# Patient Record
Sex: Male | Born: 1970 | Race: White | Hispanic: No | Smoking: Never smoker
Health system: Southern US, Community
[De-identification: ages and names within clinical notes are randomized; demographics above are authoritative.]

## PROBLEM LIST (undated history)

## (undated) DIAGNOSIS — I456 Pre-excitation syndrome: Secondary | ICD-10-CM

## (undated) DIAGNOSIS — N200 Calculus of kidney: Secondary | ICD-10-CM

## (undated) DIAGNOSIS — B019 Varicella without complication: Secondary | ICD-10-CM

## (undated) DIAGNOSIS — I493 Ventricular premature depolarization: Secondary | ICD-10-CM

## (undated) DIAGNOSIS — J90 Pleural effusion, not elsewhere classified: Secondary | ICD-10-CM

## (undated) DIAGNOSIS — K219 Gastro-esophageal reflux disease without esophagitis: Secondary | ICD-10-CM

## (undated) HISTORY — DX: Varicella without complication: B01.9

## (undated) HISTORY — PX: ADENOIDECTOMY: SHX5191

## (undated) HISTORY — DX: Pleural effusion, not elsewhere classified: J90

## (undated) HISTORY — DX: Calculus of kidney: N20.0

## (undated) HISTORY — DX: Gastro-esophageal reflux disease without esophagitis: K21.9

## (undated) HISTORY — DX: Ventricular premature depolarization: I49.3

---

## 2012-05-27 ENCOUNTER — Ambulatory Visit: Payer: Self-pay | Admitting: Adult Health

## 2014-04-10 ENCOUNTER — Encounter: Payer: Self-pay | Admitting: Podiatry

## 2014-04-10 ENCOUNTER — Ambulatory Visit (INDEPENDENT_AMBULATORY_CARE_PROVIDER_SITE_OTHER): Payer: BC Managed Care – PPO

## 2014-04-10 ENCOUNTER — Ambulatory Visit (INDEPENDENT_AMBULATORY_CARE_PROVIDER_SITE_OTHER): Payer: BC Managed Care – PPO | Admitting: Podiatry

## 2014-04-10 VITALS — BP 129/81 | HR 87 | Resp 16 | Ht 71.0 in | Wt 245.0 lb

## 2014-04-10 DIAGNOSIS — M779 Enthesopathy, unspecified: Secondary | ICD-10-CM

## 2014-04-10 DIAGNOSIS — Q828 Other specified congenital malformations of skin: Secondary | ICD-10-CM

## 2014-04-10 MED ORDER — TRIAMCINOLONE ACETONIDE 10 MG/ML IJ SUSP
10.0000 mg | Freq: Once | INTRAMUSCULAR | Status: AC
  Administered 2014-04-10: 10 mg

## 2014-04-10 NOTE — Progress Notes (Signed)
   Subjective:    Patient ID: Eugene Robinson, male    DOB: [birth year], 43 y.o.   MRN: [id number]  HPI Comments: My right foot had a knot to pop up on the outside (lateral) about 2 months ago. The last two toes are tingling and sometimes numb. i have a lot of pain in my foot. After sitting will hurt. It feels like its broken. i cant bend my big toe on my rt foot. i had broken my foot a year ago but it healed. If i bang my foot on anything it will almost bring tears to my eyes. i take ibuprofen and that's it.  Foot Pain      Review of Systems  Cardiovascular:       Calf pain   Musculoskeletal: Positive for back pain.       Difficulty walking  All other systems reviewed and are negative.      Objective:   Physical Exam        Assessment & Plan:

## 2014-04-11 NOTE — Progress Notes (Signed)
Subjective:     Patient ID: Eugene Robinson, male   DOB: [birth year], 43 y.o.   MRN: [id number]  HPI patient states I'm getting a lot of pain in the outside of my right foot that is making it difficult for me to walk and I think there might be a lesion there. Patient states he walks on cement floors all day and is increasingly having trouble working   Review of Systems     Objective:   Physical Exam Neurovascular status is intact with muscle strength adequate and range of motion of the subtalar and midtarsal joint within normal limits. Patient is noted to have good digital perfusion is well oriented 3 and is noted on the right foot to have inflammation around the fifth metatarsal head that's very tender when pressed and does have lesion formation on the plantar lateral aspect of the fifth metatarsal that's painful when pressed    Assessment:     Inflammatory capsulitis right fifth MPJ with keratotic lesion formation that are painful when pressed    Plan:     H&P and x-rays reviewed. I injected the right fifth MPJ plantar 3 mg Kenalog 5 mg Xylocaine today and I went ahead and after I had also appropriate numbness I debrided the lesions fully. Applied sterile dressing and instructed on reappoint point when symptoms recur

## 2014-11-06 ENCOUNTER — Emergency Department
Admission: EM | Admit: 2014-11-06 | Discharge: 2014-11-06 | Disposition: A | Discharge status: Home / Self Care | Payer: BLUE CROSS/BLUE SHIELD | Attending: Emergency Medicine | Admitting: Emergency Medicine

## 2014-11-06 ENCOUNTER — Other Ambulatory Visit: Payer: Self-pay

## 2014-11-06 ENCOUNTER — Encounter: Payer: Self-pay | Admitting: Emergency Medicine

## 2014-11-06 ENCOUNTER — Emergency Department: Payer: BLUE CROSS/BLUE SHIELD

## 2014-11-06 ENCOUNTER — Telehealth: Payer: Self-pay | Admitting: Cardiovascular Disease

## 2014-11-06 DIAGNOSIS — I456 Pre-excitation syndrome: Secondary | ICD-10-CM | POA: Diagnosis not present

## 2014-11-06 DIAGNOSIS — R079 Chest pain, unspecified: Secondary | ICD-10-CM | POA: Diagnosis present

## 2014-11-06 HISTORY — DX: Pre-excitation syndrome: I45.6

## 2014-11-06 LAB — COMPREHENSIVE METABOLIC PANEL
ALBUMIN: 4.5 g/dL (ref 3.5–5.0)
ALK PHOS: 96 U/L (ref 38–126)
ALT: 30 U/L (ref 17–63)
ANION GAP: 9 (ref 5–15)
AST: 32 U/L (ref 15–41)
BUN: 15 mg/dL (ref 6–20)
CALCIUM: 9 mg/dL (ref 8.9–10.3)
CO2: 26 mmol/L (ref 22–32)
Chloride: 105 mmol/L (ref 101–111)
Creatinine, Ser: 1.04 mg/dL (ref 0.61–1.24)
GLUCOSE: 94 mg/dL (ref 65–99)
Potassium: 3.9 mmol/L (ref 3.5–5.1)
Sodium: 140 mmol/L (ref 135–145)
Total Bilirubin: 1.1 mg/dL (ref 0.3–1.2)
Total Protein: 8.2 g/dL — ABNORMAL HIGH (ref 6.5–8.1)

## 2014-11-06 LAB — CBC
HCT: 47.8 % (ref 40.0–52.0)
HEMOGLOBIN: 16.1 g/dL (ref 13.0–18.0)
MCH: 28.4 pg (ref 26.0–34.0)
MCHC: 33.6 g/dL (ref 32.0–36.0)
MCV: 84.4 fL (ref 80.0–100.0)
PLATELETS: 225 10*3/uL (ref 150–440)
RBC: 5.66 MIL/uL (ref 4.40–5.90)
RDW: 13.2 % (ref 11.5–14.5)
WBC: 8 10*3/uL (ref 3.8–10.6)

## 2014-11-06 LAB — TROPONIN I: Troponin I: <0.03 ng/mL (ref <0.031)

## 2014-11-06 NOTE — ED Notes (Signed)
Pt to ed with c/o left side chest pain that started about 5 days ago,  Reports pain is intermittent with sob and felt like he was going to pass out.  Pt alert and oriented now, hx of WPW.  EKG done on arrival.

## 2014-11-06 NOTE — Discharge Instructions (Signed)
Wolff-Parkinson-White Syndrome  Wolff-Parkinson-White Syndrome (WPW) is an abnormal heart condition that can cause the heart to beat very fast.  CAUSES   In WPW syndrome, an extra electrical connection (pathway) exists between the top chambers of your heart (atria) and the bottom chambers of your heart (ventricles). This is known as an extra (accessory) pathway. This extra pathway can cause the heart to short circuit and beat very fast.  SYMPTOMS   Symptoms in WPW syndrome can vary. These include:  · Feeling your heart "skip" beats (palpitations).  · Dizziness.  · Fainting or near fainting.  · Sudden death.  DIAGNOSIS   WPW can be diagnosed by different test such as:  · ECG (electrocardiogram).  · Echocardiogram.  · Holter monitoring.  · Stress testing.  · Electrophysiology study.  · Laboratory tests that check certain blood levels.  · Thyroid study.  TREATMENT   WPW is usually treated in two ways:  · Radiofrequency destruction (ablation). In this procedure, a thin, flexible tube (catheter) is placed in the heart through a vein in the upper leg (groin). The catheter is guided to the extra pathway. The extra pathway is destroyed by using high frequency radio waves.  · Medicine can sometimes be used to treat WPW.  SEEK IMMEDIATE MEDICAL CARE IF:   · You feel palpitations that are frequent or continual.  · You develop chest pain and also have:  ¨ Shortness of breath or difficulty breathing.  ¨ Nausea and vomiting.  ¨ Sweating.  · You become light-headed or faint (pass out).  MAKE SURE YOU:   · Understand these instructions.  · Will watch your condition.  · Will get help right away if you are not doing well or get worse.  Document Released: 06/20/2003 Document Revised: 06/22/2011 Document Reviewed: 06/16/2008  ExitCare® Patient Information ©2015 [employer]. This information is not intended to replace advice given to you by your health care provider. Make sure you discuss any questions you have with your health care  provider.

## 2014-11-06 NOTE — Telephone Encounter (Signed)
Patient presented to the emergency room with tachycardia, chest discomfort EKG consistent with WPW Now feeling better, initial set of cardiac enzymes negative Second set of cardiac enzymes pending We'll arrange follow-up with Dr. Graciela Husbands, EP to discuss management of his WPW

## 2014-11-06 NOTE — ED Provider Notes (Signed)
[facility] Emergency Department Provider Note  ____________________________________________  Time seen: On arrival  I have reviewed the triage vital signs and the nursing notes.   HISTORY  Chief Complaint Chest Pain    HPI Eugene Robinson is a 44 y.o. male who presents after an episode this morning where he thought that he might faint. He reports that he felt his heart was racing very rapidly he became dizzy, his chest felt tight and he thought he was going to pass out. He has never had this before he does have a history of WPW but reports is never caused him any problems. He denies chest pain now in fact says he feels significant better. No recent travel, no calf pain, no leg swelling. He has not taken any medication for Wolff-Parkinson-White     Past Medical History  Diagnosis Date  . WPW (Wolff-Parkinson-White syndrome)     There are no active problems to display for this patient.   Past Surgical History  Procedure Laterality Date  . Adenoidectomy      Current Outpatient Rx  Name  Route  Sig  Dispense  Refill  . ibuprofen (ADVIL,MOTRIN) 200 MG tablet   Oral   Take 200 mg by mouth as needed.           Allergies Review of patient's allergies indicates no known allergies.  Family History  Problem Relation Age of Onset  . Cancer Mother   . Parkinson's disease Mother     Social History History  Substance Use Topics  . Smoking status: Never Smoker   . Smokeless tobacco: Not on file  . Alcohol Use: No    Review of Systems  Constitutional: Negative for fever. Eyes: Negative for visual changes. ENT: Negative for sore throat Cardiovascular: Positive for chest pain Respiratory: Negative for shortness of breath. Gastrointestinal: Negative for abdominal pain, vomiting and diarrhea. Genitourinary: Negative for dysuria. Musculoskeletal: Negative for back pain. Skin: Negative for rash. Neurological: Negative for headaches or focal  weakness Psychiatric: No anxiety  10-point ROS otherwise negative.  ____________________________________________   PHYSICAL EXAM:  VITAL SIGNS: ED Triage Vitals  Enc Vitals Group     BP 11/06/14 1035 100/71 mmHg     Pulse Rate 11/06/14 1035 88     Resp 11/06/14 1035 18     Temp 11/06/14 1035 97.9 F (36.6 C)     Temp src --      SpO2 11/06/14 1035 96 %     Weight 11/06/14 1035 245 lb (111.131 kg)     Height 11/06/14 1035 5\' 11"  (1.803 m)     Head Cir --      Peak Flow --      Pain Score 11/06/14 1035 5     Pain Loc --      Pain Edu? --      Excl. in GC? --      Constitutional: Alert and oriented. Well appearing and in no distress. Eyes: Conjunctivae are normal.  ENT   Head: Normocephalic and atraumatic.   Mouth/Throat: Mucous membranes are moist. Cardiovascular: Normal rate, regular rhythm. Normal and symmetric distal pulses are present in all extremities. No murmurs, rubs, or gallops. Respiratory: Normal respiratory effort without tachypnea nor retractions. Breath sounds are clear and equal bilaterally.  Gastrointestinal: Soft and non-tender in all quadrants. No distention. There is no CVA tenderness. Genitourinary: deferred Musculoskeletal: Nontender with normal range of motion in all extremities. No lower extremity tenderness nor edema. Neurologic:  Normal speech  and language. No gross focal neurologic deficits are appreciated. Skin:  Skin is warm, dry and intact. No rash noted. Psychiatric: Mood and affect are normal. Patient exhibits appropriate insight and judgment.  ____________________________________________    LABS (pertinent positives/negatives)  Labs Reviewed  COMPREHENSIVE METABOLIC PANEL - Abnormal; Notable for the following:    Total Protein 8.2 (*)    All other components within normal limits  CBC  TROPONIN I  TROPONIN I    ____________________________________________   EKG  ED ECG REPORT I, Jene Every, the attending  physician, personally viewed and interpreted this ECG.   Date: 11/06/2014  EKG Time: 10:31 AM  Rate: 99  Rhythm: Sinus rhythm with PVCs  Axis: Normal  Intervals:Wolff-Parkinson-White  ST&T Change: None   ____________________________________________    RADIOLOGY I have personally reviewed any xrays that were ordered on this patient: Chest x-ray unremarkable  ____________________________________________   PROCEDURES  Procedure(s) performed: none  Critical Care performed: none  ____________________________________________   INITIAL IMPRESSION / ASSESSMENT AND PLAN / ED COURSE  Pertinent labs & imaging results that were available during my care of the patient were reviewed by me and considered in my medical decision making (see chart for details).  Initial labs reassuring. Patient feels well in the Emergency department and chest pain has resolved. Presentation is concerning for arrhythmia related to the WPW. Initial labs reassuring. Discussed with Dr. Mariah Milling of Cardiology. We will recheck 2nd troponin given that patient feels well. Outpatient EP follow up arranged. Patient and wife agree with plan.   ----------------------------------------- 3:01 PM on 11/06/2014 -----------------------------------------  Patient feels well. Second troponin normal. Return precautions given  ____________________________________________   FINAL CLINICAL IMPRESSION(S) / ED DIAGNOSES  Final diagnoses:  WPW (Wolff-Parkinson-White syndrome)     Jene Every, MD 11/06/14 [phone]

## 2014-11-07 ENCOUNTER — Encounter: Payer: Self-pay | Admitting: Family Medicine

## 2014-11-07 ENCOUNTER — Ambulatory Visit (INDEPENDENT_AMBULATORY_CARE_PROVIDER_SITE_OTHER): Payer: BLUE CROSS/BLUE SHIELD | Admitting: Family Medicine

## 2014-11-07 VITALS — BP 112/76 | HR 69 | Temp 98.1°F | Ht 70.75 in | Wt 250.1 lb

## 2014-11-07 DIAGNOSIS — Z13220 Encounter for screening for lipoid disorders: Secondary | ICD-10-CM

## 2014-11-07 DIAGNOSIS — Z0000 Encounter for general adult medical examination without abnormal findings: Secondary | ICD-10-CM | POA: Insufficient documentation

## 2014-11-07 DIAGNOSIS — I456 Pre-excitation syndrome: Secondary | ICD-10-CM | POA: Insufficient documentation

## 2014-11-07 DIAGNOSIS — Z23 Encounter for immunization: Secondary | ICD-10-CM | POA: Diagnosis not present

## 2014-11-07 DIAGNOSIS — E669 Obesity, unspecified: Secondary | ICD-10-CM | POA: Insufficient documentation

## 2014-11-07 DIAGNOSIS — Z136 Encounter for screening for cardiovascular disorders: Secondary | ICD-10-CM

## 2014-11-07 NOTE — Patient Instructions (Signed)
It was nice to see you today.  Follow up next week for your labs and tetanus shot.  Take care  Dr. Adriana Simas

## 2014-11-07 NOTE — Assessment & Plan Note (Signed)
Encouraged healthy diet and exercise (patient to abstain from exercise at this time until he is evaluated by cardiologist Dr. Graciela Husbands).

## 2014-11-07 NOTE — Assessment & Plan Note (Signed)
Patient doing okay at this time. Patient to see EP cardiologist Tues. Patient to abstain from exercise/heavy lifting/strenous activity until sees cards.

## 2014-11-07 NOTE — Progress Notes (Signed)
   Subjective:    Patient ID: Eugene Robinson, male    DOB: 06/29/1970, 44 y.o.   MRN: [id number]  HPI 44 year old male presents to clinic today to establish care and for an annual physical exam. Current issues below:  Preventative health care  Patient in need of tetanus immunization.  Given current guidelines patient need of screening lipid panel.   No other preventative care measures needed.  WPW  Patient reports he was diagnosed years ago with WPW.  Patient recently had to go the ED after he developed tachycardia and felt faint.   WPW was again noted on EKG.  Labs and workup were unremarkable.  Patient was discharged home in stable condition and EP follow up with Dr. Graciela Husbands was scheduled (he sees him on Tuesday).  Patient reports that since discharge the hospital he still has intermittent appears of time where he does not feel like himself. He states that his heart feels like a "roller coaster".  He has had no further episodes where he feels like he is going to faint.  No current chest pain, SOB or palpitations.  Review of Systems Per HPI.  All other systems negative.     Objective: Blood pressure 112/76, pulse 69, temperature 98.1 F (36.7 C), temperature source Oral, height 5' 10.75" (1.797 m), weight 250 lb 2 oz (113.456 kg), SpO2 95 %. Body mass index is 35.13 kg/(m^2).    Physical Exam  Constitutional: He is oriented to person, place, and time. He appears well-developed and well-nourished. No distress.  Obese.   HENT:  Head: Normocephalic and atraumatic.  Mouth/Throat: Oropharynx is clear and moist. No oropharyngeal exudate.  Eyes: No scleral icterus.  Neck: Neck supple.  Cardiovascular: Normal rate and regular rhythm.   No murmur heard. Pulmonary/Chest: Effort normal and breath sounds normal. No respiratory distress. He has no wheezes. He has no rales.  Abdominal: Soft. He exhibits no distension. There is no tenderness. There is no rebound and no guarding.    Musculoskeletal: Normal range of motion. He exhibits no edema.  Lymphadenopathy:    He has no cervical adenopathy.  Neurological: He is alert and oriented to person, place, and time.  Skin: Skin is warm and dry. No rash noted.  Psychiatric: He has a normal mood and affect.  Vitals reviewed.     Assessment & Plan:  See Problem List

## 2014-11-07 NOTE — Telephone Encounter (Signed)
Pt is scheduled for 11/13/2014 8:45 - Dr. Graciela Husbands

## 2014-11-07 NOTE — Assessment & Plan Note (Signed)
Patient in need Tdap and lipid screening. Patient will return for this as he does not want to be stuck as blood and IV's were recently done at [facility]. F/u appt scheduled.

## 2014-11-07 NOTE — Progress Notes (Signed)
Pre visit review using our clinic review tool, if applicable. No additional management support is needed unless otherwise documented below in the visit note. 

## 2014-11-13 ENCOUNTER — Encounter: Payer: Self-pay | Admitting: Internal Medicine

## 2014-11-13 ENCOUNTER — Ambulatory Visit: Payer: BLUE CROSS/BLUE SHIELD | Admitting: Internal Medicine

## 2014-11-13 VITALS — BP 118/82 | HR 83 | Ht 70.0 in | Wt 248.8 lb

## 2014-11-13 DIAGNOSIS — R0602 Shortness of breath: Secondary | ICD-10-CM | POA: Diagnosis not present

## 2014-11-13 DIAGNOSIS — R5383 Other fatigue: Secondary | ICD-10-CM | POA: Diagnosis not present

## 2014-11-13 DIAGNOSIS — I456 Pre-excitation syndrome: Secondary | ICD-10-CM | POA: Diagnosis not present

## 2014-11-13 MED ORDER — ATENOLOL 50 MG PO TABS: 50.0000 mg | ORAL_TABLET | Freq: Every day | ORAL | Status: DC

## 2014-11-13 MED ORDER — METOPROLOL TARTRATE 50 MG PO TABS: 50.0000 mg | ORAL_TABLET | Freq: Two times a day (BID) | ORAL | Status: DC

## 2014-11-13 MED ORDER — METOPROLOL SUCCINATE ER 50 MG PO TB24: 50.0000 mg | ORAL_TABLET | Freq: Every day | ORAL | Status: DC

## 2014-11-13 NOTE — Progress Notes (Signed)
ELECTROPHYSIOLOGY CONSULT NOTE  Patient ID: Eugene Robinson, MRN: [id number], DOB/AGE: 12-27-1970 44 y.o. Admit date: (Not on file) Date of Consult: 11/13/2014  Primary Physician: Everlene Other, DO Primary Cardiologist: new  Chief Complaint: WPW   HPI Eugene Robinson is a 44 y.o. male  Referred from the emergency room following an episode of tachycardia palpitations and presyncope in the context of little echocardiogram demonstrating ventricular preexcitation.  His ECG have been identified 20 years ago when he presented to Dr. Glennon Hamilton with chest pain. He had had at that time no tachycardia palpitations and no interventions were recommended.  He has a long history of flutters. These are sometimes associated with dyspnea. More recently he had an episode where the context of flutters his heart started to race. In the past these episodes had lasted fractions of minutes; this episode lasted much longer i.e. 10-15 minutes.  Since that time his struggled with exercise intolerance. He notes that the flutters that he has had infrequently in the past, i.e. monthly are now occurring all day long.  He has sleep disordered breathing and snores.      Past Medical History  Diagnosis Date  . WPW (Wolff-Parkinson-White syndrome)   . Chicken pox   . GERD (gastroesophageal reflux disease)   . Kidney stones       Surgical History:  Past Surgical History  Procedure Laterality Date  . Adenoidectomy       Home Meds: Prior to Admission medications   Medication Sig Start Date End Date Taking? Authorizing Provider  ibuprofen (ADVIL,MOTRIN) 200 MG tablet Take 200 mg by mouth as needed.   Yes Historical Provider, MD      Allergies: No Known Allergies  History   Social History  . Marital Status: Married    Spouse Name: N/A  . Number of Children: N/A  . Years of Education: N/A   Occupational History  . Not on file.   Social History Main Topics  . Smoking status: Never Smoker   .  Smokeless tobacco: Not on file  . Alcohol Use: No  . Drug Use: No  . Sexual Activity: Not on file   Other Topics Concern  . Not on file   Social History Narrative     Family History  Problem Relation Age of Onset  . Cancer Mother     lung and breast  . Parkinson's disease Mother      ROS:  Please see the history of present illness.     All other systems reviewed and negative.    Physical Exam: Blood pressure 118/82, pulse 83, height  (1.778 m), weight 248 lb 12 oz (112.832 kg). General: Well developed, well nourished male in no acute distress. Head: Normocephalic, atraumatic, sclera non-icteric, no xanthomas, nares are without discharge. EENT: normal Lymph Nodes:  none Back: without scoliosis/kyphosis, no CVA tendersness Neck: Negative for carotid bruits. JVD not elevated. Lungs: Clear bilaterally to auscultation without wheezes, rales, or rhonchi. Breathing is unlabored. Heart: RRR with S1 S2. No  murmur , rubs, or gallops appreciated. Abdomen: Soft, non-tender, non-distended with normoactive bowel sounds. No hepatomegaly. No rebound/guarding. No obvious abdominal masses. Msk:  Strength and tone appear normal for age. Extremities: No clubbing or cyanosis. No edema.  Distal pedal pulses are 2+ and equal bilaterally. Skin: Warm and Dry Neuro: Alert and oriented X 3. CN III-XII intact Grossly normal sensory and motor function . Psych:  Responds to questions appropriately with a normal affect.  Labs: Cardiac Enzymes No results for input(s): CKTOTAL, CKMB, TROPONINI in the last 72 hours. CBC Lab Results  Component Value Date   WBC 8.0 11/06/2014   HGB 16.1 11/06/2014   HCT 47.8 11/06/2014   MCV 84.4 11/06/2014   PLT 225 11/06/2014   PROTIME: No results for input(s): LABPROT, INR in the last 72 hours. Chemistry  Recent Labs Lab 11/06/14 1111  NA 140  K 3.9  CL 105  CO2 26  BUN 15  CREATININE 1.04  CALCIUM 9.0  PROT 8.2*  BILITOT 1.1  ALKPHOS 96    ALT 30  AST 32  GLUCOSE 94   Lipids No results found for: CHOL, HDL, LDLCALC, TRIG BNP No results found for: PROBNP Thyroid Function Tests: No results for input(s): TSH, T4TOTAL, T3FREE, THYROIDAB in the last 72 hours.  Invalid input(s): FREET3    Miscellaneous No results found for: DDIMER  Radiology/Studies:  Dg Chest Portable 1 View  11/06/2014   CLINICAL DATA:  Left chest pain starting 5 days ago  EXAM: PORTABLE CHEST - 1 VIEW  COMPARISON:  None.  FINDINGS: Mediastinal contour is normal. Heart size is enlarged. The lung volumes are low. There is mild central pulmonary vascular congestion. There is no focal infiltrate, pulmonary edema, or pleural effusion. The visualized skeletal structures are unremarkable.  IMPRESSION: Mild central pulmonary vascular congestion. No focal pneumonia or frank edema.   Electronically Signed   By: Sherian Rein M.D.   On: 11/06/2014 12:08    EKG: SR  Negative delta wave in lead 3 positive delta wave 1, L, transition V1-V2 consistent with a right posterior septal pathway ECG 726 demonstrates sinus rhythm with ventricular preexcitation and PVCs with a right bundle superior axis morphology  Assessment and Plan:  Wolff-Parkinson-White  PVCs-right bundle superior axis  Dyspnea on exertion   The patient has Wolff-Parkinson-White syndrome with ventricular preexcitation and tachypalpitations. In the wake of this event a week ago he's had profound dyspnea and exercise intolerance associated with a perceived increase in his flutters which I suspect are related to the PVCs noted on his electrocardiogram.  It would not be my expectation that the event of tachycardia last week would have residual effects.  Given this, I'm not sure that ablation of his accessory pathway for the purpose of relief of his dyspnea symptoms is going to be helpful; hence, we have discussed the role of beta blocker therapy and I have given him prescriptions for atenolol, metoprolol  succinate and tartrate and have reviewed the side effects to see if we can find a beta blocker that works to decrease his PVC burden and hopefully thereby improve his exercise performance. He and his wife both do not think that this is related to anxiety. Attributable to the tachycardia episode.   We will undertake a stress test to see if we can understand what's happening to cause his exercise intolerance whether to increase his PVC frequency a rapid escalation in heart rate. I'm not sure the under mechanism of this dizziness; we will do orthostatics today.  We'll also undertake an echocardiogram; there is a syndrome of WPW and HCM   Sherryl Manges

## 2014-11-13 NOTE — Patient Instructions (Addendum)
Medication Instructions:  Your physician has recommended you make the following change in your medication:  You have been given three prescriptions: Atenolol 50mg  once per day Metoprolol tartrate 50mg  twice per day Metoprolol succinate 50mg  once per day. DO NOT take all three medications at the same time. Per MD instructions, choose one to try. If still symptomatic, discontinue and fill one of the other prescriptions until you find one that works for you. Call us back for refills on the one you choose.   Labwork: none  Testing/Procedures: Your physician has requested that you have an echocardiogram. Echocardiography is a painless test that uses sound waves to create images of your heart. It provides your doctor with information about the size and shape of your heart and how well your heart's chambers and valves are working. This procedure takes approximately one hour. There are no restrictions for this procedure.  Your physician has requested that you have a lexiscan myoview.    [facility] MYOVIEW  Your caregiver has ordered a Stress Test with nuclear imaging. The purpose of this test is to evaluate the blood supply to your heart muscle. This procedure is referred to as a "Non-Invasive Stress Test." This is because other than having an IV started in your vein, nothing is inserted or "invades" your body. Cardiac stress tests are done to find areas of poor blood flow to the heart by determining the extent of coronary artery disease (CAD). Some patients exercise on a treadmill, which naturally increases the blood flow to your heart, while others who are  unable to walk on a treadmill due to physical limitations have a pharmacologic/chemical stress agent called Lexiscan . This medicine will mimic walking on a treadmill by temporarily increasing your coronary blood flow.   Please note: these test may take anywhere between 2-4 hours to complete  PLEASE REPORT TO [facility]  THE VOLUNTEERS  AT THE FIRST DESK WILL DIRECT YOU WHERE TO GO  Date of Procedure: Thursday, August 4, 8:00am  Arrival Time for Procedure: 7:15am  Instructions regarding medication:   ____ : Hold diabetes medication morning of procedure  ___xxx_:  Hold betablocker(s) night before procedure and morning of procedure. Do not take atenolol or metoprolol the evening before or morning of your procedure  ____:  Hold other medications as follows:_________________________________________________________________________________________________________________________________________________________________________________________________________________________________________________________________________________________  PLEASE NOTIFY THE OFFICE AT LEAST 24 HOURS IN ADVANCE IF YOU ARE UNABLE TO KEEP YOUR APPOINTMENT.  [phone] AND  PLEASE NOTIFY NUCLEAR MEDICINE AT [facility] AT LEAST 24 HOURS IN ADVANCE IF YOU ARE UNABLE TO KEEP YOUR APPOINTMENT. [phone]  How to prepare for your Myoview test:   Do not eat or drink after midnight  No caffeine for 24 hours prior to test  No smoking 24 hours prior to test.  Your medication may be taken with water.  If your doctor stopped a medication because of this test, do not take that medication.  Ladies, please do not wear dresses.  Skirts or pants are appropriate. Please wear a short sleeve shirt.  No perfume, cologne or lotion.  Wear comfortable walking shoes. No heels!            Follow-Up: Your physician recommends that you schedule a follow-up appointment in: six weeks with Dr. Graciela Husbands.   Any Other Special Instructions Will Be Listed Below (If Applicable).  Echocardiogram An echocardiogram, or echocardiography, uses sound waves (ultrasound) to produce an image of your heart. The echocardiogram is simple, painless, obtained within a short period of time,  and offers valuable information to your health care provider. The images from an echocardiogram  can provide information such as:  Evidence of coronary artery disease (CAD).  Heart size.  Heart muscle function.  Heart valve function.  Aneurysm detection.  Evidence of a past heart attack.  Fluid buildup around the heart.  Heart muscle thickening.  Assess heart valve function. LET [facility] CARE PROVIDER KNOW ABOUT:  Any allergies you have.  All medicines you are taking, including vitamins, herbs, eye drops, creams, and over-the-counter medicines.  Previous problems you or members of your family have had with the use of anesthetics.  Any blood disorders you have.  Previous surgeries you have had.  Medical conditions you have.  Possibility of pregnancy, if this applies. BEFORE THE PROCEDURE  No special preparation is needed. Eat and drink normally.  PROCEDURE   In order to produce an image of your heart, gel will be applied to your chest and a wand-like tool (transducer) will be moved over your chest. The gel will help transmit the sound waves from the transducer. The sound waves will harmlessly bounce off your heart to allow the heart images to be captured in real-time motion. These images will then be recorded.  You may need an IV to receive a medicine that improves the quality of the pictures. AFTER THE PROCEDURE You may return to your normal schedule including diet, activities, and medicines, unless your health care provider tells you otherwise. Document Released: 03/27/2000 Document Revised: 08/14/2013 Document Reviewed: 12/05/2012 [facility] Patient Information 2015 [place], [place]. This information is not intended to replace advice given to you by your health care provider. Make sure you discuss any questions you have with your health care provider. Nuclear Medicine Exam A nuclear medicine exam is a safe and painless imaging test. It helps to detect and diagnose disease in the body as well as provide information about organ function and structure.  Nuclear  scans are most often done of the:  Lungs.  Heart.  Thyroid gland.  Bones.  Abdomen. HOW A NUCLEAR MEDICINE EXAM WORKS A nuclear medicine exam works by using a radioactive tracer. The material is given either by an IV (intravenous) injection or it may be swallowed. After the tracer is in the body, it is absorbed by your body's organs. A large scanning machine that uses a special camera detects the radioactivity in your body. A computerized image is then formed regarding the area of concern. The small amounts of radioactive material used in a nuclear medicine exam are found to be medically safe. However, because radioactive material is used, this test is not done if you are pregnant or nursing.  BEFORE THE PROCEDURE  If available, bring previous imaging studies such as x-rays, etc. with you to the exam.  Arrive early for your exam. PROCEDURE  An IV may be started before the exam begins.  Depending on the type of examination, will lie on a table or sit in a chair during the exam.  The nuclear medicine exam will take about 30 to 60 minutes to complete. AFTER THE PROCEDURE  After your scan is completed, the image(s) will be evaluated by a specialist. It is important that you follow up with your caregiver to find out your test results.  You may return to your regular activity as instructed by your caregiver. SEEK IMMEDIATE MEDICAL CARE IF: You have shortness of breath or difficulty breathing. MAKE SURE YOU:   Understand these instructions.  Will watch your condition.  Will get help right away if you are not doing well or get worse. Document Released: 05/07/2004 Document Revised: 06/22/2011 Document Reviewed: 06/21/2008 [facility] Patient Information 2015 [place], [place]. This information is not intended to replace advice given to you by your health care provider. Make sure you discuss any questions you have with your health care provider.

## 2014-11-14 ENCOUNTER — Telehealth: Payer: Self-pay

## 2014-11-14 ENCOUNTER — Telehealth: Payer: Self-pay | Admitting: Internal Medicine

## 2014-11-14 ENCOUNTER — Other Ambulatory Visit (INDEPENDENT_AMBULATORY_CARE_PROVIDER_SITE_OTHER): Payer: BLUE CROSS/BLUE SHIELD

## 2014-11-14 ENCOUNTER — Ambulatory Visit: Payer: BLUE CROSS/BLUE SHIELD

## 2014-11-14 DIAGNOSIS — Z136 Encounter for screening for cardiovascular disorders: Secondary | ICD-10-CM

## 2014-11-14 DIAGNOSIS — Z13220 Encounter for screening for lipoid disorders: Secondary | ICD-10-CM

## 2014-11-14 DIAGNOSIS — E669 Obesity, unspecified: Secondary | ICD-10-CM

## 2014-11-14 DIAGNOSIS — Z23 Encounter for immunization: Secondary | ICD-10-CM

## 2014-11-14 LAB — LIPID PANEL
Cholesterol: 129 mg/dL (ref 0–200)
HDL: 33.5 mg/dL — ABNORMAL LOW (ref >39.00)
LDL Cholesterol: 74 mg/dL (ref 0–99)
NonHDL: 95.68
TRIGLYCERIDES: 109 mg/dL (ref 0.0–149.0)
Total CHOL/HDL Ratio: 4
VLDL: 21.8 mg/dL (ref 0.0–40.0)

## 2014-11-14 LAB — HEMOGLOBIN A1C: HEMOGLOBIN A1C: 5.4 % (ref 4.6–6.5)

## 2014-11-14 MED ORDER — TETANUS-DIPHTH-ACELL PERTUSSIS 5-2.5-18.5 LF-MCG/0.5 IM SUSP
0.5000 mL | Freq: Once | INTRAMUSCULAR | Status: AC
  Administered 2014-11-14: 0.5 mL via INTRAMUSCULAR

## 2014-11-14 NOTE — Telephone Encounter (Signed)
Pt called back and was given his results.

## 2014-11-14 NOTE — Telephone Encounter (Signed)
Patient called to cancel nm stress at [facility], echo, and fu with klein because his wife just had surgery and they cannot afford insurance deductible for testing at this time. Per patient he will call back at a later time to reschedule.  Notified scheduling at [facility].  Fwd to pcp.

## 2014-11-14 NOTE — Progress Notes (Signed)
Patient came for immunization, tdap.  Received in right deltoid.  Patient tolerated well.

## 2014-11-14 NOTE — Telephone Encounter (Signed)
Cancelled myoview with scheduling.  S/w pt to inform him that we have cancelled tests and to notify us when he feels like he can reschedule. Forward to [employer]

## 2014-11-14 NOTE — Telephone Encounter (Signed)
Called patient to give him his lab results, but there was no answer told him to call me back to get his results.

## 2014-11-16 ENCOUNTER — Other Ambulatory Visit: Payer: BLUE CROSS/BLUE SHIELD

## 2014-12-18 ENCOUNTER — Ambulatory Visit: Payer: BLUE CROSS/BLUE SHIELD | Admitting: Internal Medicine

## 2015-01-03 ENCOUNTER — Other Ambulatory Visit: Payer: Self-pay | Admitting: Internal Medicine

## 2015-01-21 ENCOUNTER — Telehealth: Payer: Self-pay | Admitting: *Deleted

## 2015-01-21 NOTE — Telephone Encounter (Signed)
Pt will call us back to let us know which medication pt will need refill.

## 2015-01-22 ENCOUNTER — Ambulatory Visit: Payer: BLUE CROSS/BLUE SHIELD | Admitting: Internal Medicine

## 2015-03-19 ENCOUNTER — Ambulatory Visit (INDEPENDENT_AMBULATORY_CARE_PROVIDER_SITE_OTHER): Payer: BLUE CROSS/BLUE SHIELD | Admitting: Internal Medicine

## 2015-03-19 ENCOUNTER — Encounter: Payer: Self-pay | Admitting: Internal Medicine

## 2015-03-19 VITALS — BP 116/76 | HR 68 | Ht 70.0 in | Wt 254.8 lb

## 2015-03-19 DIAGNOSIS — I493 Ventricular premature depolarization: Secondary | ICD-10-CM

## 2015-03-19 DIAGNOSIS — R0602 Shortness of breath: Secondary | ICD-10-CM | POA: Diagnosis not present

## 2015-03-19 DIAGNOSIS — I456 Pre-excitation syndrome: Secondary | ICD-10-CM

## 2015-03-19 HISTORY — DX: Ventricular premature depolarization: I49.3

## 2015-03-19 MED ORDER — METOPROLOL TARTRATE 50 MG PO TABS: 50.0000 mg | ORAL_TABLET | Freq: Two times a day (BID) | ORAL | Status: DC

## 2015-03-19 NOTE — Progress Notes (Signed)
      Patient Care Team: Tommie SamsJayce G Cook, DO as PCP - General (Family Medicine)   HPI  Eugene Robinson is a 44 y.o. male Seen in follow-up for shortness of breath. 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 Furthermore, palpitations and been associated with PVCs  He tried a variety of beta blockers; metoprolol tartrate as been associated with significant diminution in the frequency of his events i.e. flutters. He has no attributable side effects     Past Medical History  Diagnosis Date  . WPW (Wolff-Parkinson-White syndrome)   . Chicken pox   . GERD (gastroesophageal reflux disease)   . Kidney stones   . PVC's (premature ventricular contractions) 03/19/2015    Past Surgical History  Procedure Laterality Date  . Adenoidectomy      Current Outpatient Prescriptions  Medication Sig Dispense Refill  . ibuprofen (ADVIL,MOTRIN) 200 MG tablet Take 200 mg by mouth as needed.    . metoprolol (LOPRESSOR) 50 MG tablet TAKE 1 TABLET (50 MG TOTAL) BY MOUTH 2 (TWO) TIMES DAILY. 60 tablet 3   No current facility-administered medications for this visit.    No Known Allergies    Review of Systems negative except from HPI and PMH  Physical Exam BP 116/76 mmHg  Pulse 68  Ht 5\' 10"  (1.778 m)  Wt 254 lb 12 oz (115.554 kg)  BMI 36.55 kg/m2 Well developed and well nourished in no acute distress HENT normal E scleral and icterus clear Neck Supple JVP flat; carotids brisk and full Clear to ausculation  Regular rate and rhythm, no murmurs gallops or rub loud s1 Soft with active bowel sounds No clubbing cyanosis   Edema Alert and oriented, grossly normal motor and sensory function Skin Warm and Dry  ECG demonstrates sinus rhythm at 68 intervals 01/22/42 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 Right now it is cost prohibitive  I will be in touch with his regular care physician regarding a sleep study; he has high likelihood of sleep apnea

## 2015-03-19 NOTE — Patient Instructions (Signed)
Medication Instructions: - no changes  Labwork: - none  Procedures/Testing: - none  Follow-Up: - Your physician wants you to follow-up in: 1 year with Dr. Klein. You will receive a reminder letter in the mail two months in advance. If you don't receive a letter, please call our office to schedule the follow-up appointment.  Any Additional Special Instructions Will Be Listed Below (If Applicable). - none  

## 2015-03-22 ENCOUNTER — Other Ambulatory Visit: Payer: Self-pay | Admitting: Family Medicine

## 2015-03-22 DIAGNOSIS — R0683 Snoring: Secondary | ICD-10-CM

## 2015-03-22 DIAGNOSIS — G4733 Obstructive sleep apnea (adult) (pediatric): Secondary | ICD-10-CM

## 2015-04-14 DIAGNOSIS — J90 Pleural effusion, not elsewhere classified: Secondary | ICD-10-CM

## 2015-04-14 DIAGNOSIS — M869 Osteomyelitis, unspecified: Secondary | ICD-10-CM

## 2015-04-14 HISTORY — DX: Osteomyelitis, unspecified: M86.9

## 2015-04-14 HISTORY — DX: Pleural effusion, not elsewhere classified: J90

## 2015-06-26 ENCOUNTER — Ambulatory Visit: Payer: BLUE CROSS/BLUE SHIELD | Admitting: Family Medicine

## 2015-06-28 ENCOUNTER — Emergency Department: Payer: BLUE CROSS/BLUE SHIELD

## 2015-06-28 ENCOUNTER — Encounter: Payer: Self-pay | Admitting: Emergency Medicine

## 2015-06-28 ENCOUNTER — Emergency Department
Admission: EM | Admit: 2015-06-28 | Discharge: 2015-06-28 | Disposition: A | Discharge status: Home / Self Care | Payer: BLUE CROSS/BLUE SHIELD | Attending: Emergency Medicine | Admitting: Emergency Medicine

## 2015-06-28 DIAGNOSIS — R1031 Right lower quadrant pain: Secondary | ICD-10-CM | POA: Insufficient documentation

## 2015-06-28 DIAGNOSIS — R109 Unspecified abdominal pain: Secondary | ICD-10-CM

## 2015-06-28 LAB — URINALYSIS COMPLETE WITH MICROSCOPIC (ARMC ONLY)
Bacteria, UA: NONE SEEN
Bilirubin Urine: NEGATIVE
GLUCOSE, UA: NEGATIVE mg/dL
Ketones, ur: NEGATIVE mg/dL
LEUKOCYTES UA: NEGATIVE
NITRITE: NEGATIVE
PH: 5 (ref 5.0–8.0)
Protein, ur: 100 mg/dL — AB
SPECIFIC GRAVITY, URINE: 1.019 (ref 1.005–1.030)

## 2015-06-28 LAB — CBC WITH DIFFERENTIAL/PLATELET
BASOS PCT: 1 %
Basophils Absolute: 0.1 10*3/uL (ref 0–0.1)
EOS ABS: 0.2 10*3/uL (ref 0–0.7)
EOS PCT: 2 %
HCT: 42.6 % (ref 40.0–52.0)
Hemoglobin: 14.5 g/dL (ref 13.0–18.0)
Lymphocytes Relative: 7 %
Lymphs Abs: 0.7 10*3/uL — ABNORMAL LOW (ref 1.0–3.6)
MCH: 29.1 pg (ref 26.0–34.0)
MCHC: 34 g/dL (ref 32.0–36.0)
MCV: 85.7 fL (ref 80.0–100.0)
MONOS PCT: 11 %
Monocytes Absolute: 1.1 10*3/uL — ABNORMAL HIGH (ref 0.2–1.0)
Neutro Abs: 8.1 10*3/uL — ABNORMAL HIGH (ref 1.4–6.5)
Neutrophils Relative %: 79 %
PLATELETS: 194 10*3/uL (ref 150–440)
RBC: 4.97 MIL/uL (ref 4.40–5.90)
RDW: 13.1 % (ref 11.5–14.5)
WBC: 10.2 10*3/uL (ref 3.8–10.6)

## 2015-06-28 LAB — BASIC METABOLIC PANEL
Anion gap: 9 (ref 5–15)
BUN: 18 mg/dL (ref 6–20)
CHLORIDE: 102 mmol/L (ref 101–111)
CO2: 23 mmol/L (ref 22–32)
Calcium: 8.5 mg/dL — ABNORMAL LOW (ref 8.9–10.3)
Creatinine, Ser: 1.01 mg/dL (ref 0.61–1.24)
GFR calc Af Amer: >60 mL/min (ref >60)
Glucose, Bld: 118 mg/dL — ABNORMAL HIGH (ref 65–99)
Potassium: 3.6 mmol/L (ref 3.5–5.1)
SODIUM: 134 mmol/L — AB (ref 135–145)

## 2015-06-28 MED ORDER — ONDANSETRON HCL 4 MG/2ML IJ SOLN
4.0000 mg | Freq: Once | INTRAMUSCULAR | Status: AC
  Administered 2015-06-28: 4 mg via INTRAVENOUS

## 2015-06-28 MED ORDER — IOHEXOL 240 MG/ML SOLN
25.0000 mL | Freq: Once | INTRAMUSCULAR | Status: AC | PRN
  Administered 2015-06-28: 25 mL via INTRAVENOUS

## 2015-06-28 MED ORDER — IOHEXOL 300 MG/ML  SOLN
100.0000 mL | Freq: Once | INTRAMUSCULAR | Status: AC | PRN
  Administered 2015-06-28: 100 mL via INTRAVENOUS

## 2015-06-28 MED ORDER — MORPHINE SULFATE (PF) 4 MG/ML IV SOLN
4.0000 mg | Freq: Once | INTRAVENOUS | Status: AC
  Administered 2015-06-28: 4 mg via INTRAVENOUS

## 2015-06-28 MED ORDER — ONDANSETRON HCL 4 MG/2ML IJ SOLN
4.0000 mg | Freq: Once | INTRAMUSCULAR | Status: AC
  Administered 2015-06-28: 4 mg via INTRAVENOUS
  Filled 2015-06-28: qty 2

## 2015-06-28 MED ORDER — MORPHINE SULFATE (PF) 4 MG/ML IV SOLN
4.0000 mg | Freq: Once | INTRAVENOUS | Status: AC
Start: 2015-06-28 — End: 2015-06-28
  Administered 2015-06-28: 4 mg via INTRAVENOUS
  Filled 2015-06-28: qty 1

## 2015-06-28 MED ORDER — MORPHINE SULFATE (PF) 4 MG/ML IV SOLN
INTRAVENOUS | Status: AC
  Filled 2015-06-28: qty 1

## 2015-06-28 MED ORDER — ONDANSETRON HCL 4 MG/2ML IJ SOLN
INTRAMUSCULAR | Status: AC
  Filled 2015-06-28: qty 2

## 2015-06-28 MED ORDER — ONDANSETRON HCL 4 MG PO TABS: 4.0000 mg | ORAL_TABLET | Freq: Three times a day (TID) | ORAL | Status: DC | PRN

## 2015-06-28 MED ORDER — OXYCODONE-ACETAMINOPHEN 5-325 MG PO TABS: 1.0000 | ORAL_TABLET | Freq: Four times a day (QID) | ORAL | Status: DC | PRN

## 2015-06-28 NOTE — ED Notes (Signed)
Right flank pain since weds  Pain became worse this am  Positive n/v

## 2015-06-28 NOTE — Discharge Instructions (Signed)
Please seek medical attention for any high fevers, chest pain, shortness of breath, change in behavior, persistent vomiting, bloody stool or any other new or concerning symptoms. ° ° °Abdominal Pain, Adult °Many things can cause belly (abdominal) pain. Most times, the belly pain is not dangerous. Many cases of belly pain can be watched and treated at home. °HOME CARE  °· Do not take medicines that help you go poop (laxatives) unless told to by your doctor. °· Only take medicine as told by your doctor. °· Eat or drink as told by your doctor. Your doctor will tell you if you should be on a special diet. °GET HELP IF: °· You do not know what is causing your belly pain. °· You have belly pain while you are sick to your stomach (nauseous) or have runny poop (diarrhea). °· You have pain while you pee or poop. °· Your belly pain wakes you up at night. °· You have belly pain that gets worse or better when you eat. °· You have belly pain that gets worse when you eat fatty foods. °· You have a fever. °GET HELP RIGHT AWAY IF:  °· The pain does not go away within 2 hours. °· You keep throwing up (vomiting). °· The pain changes and is only in the right or left part of the belly. °· You have bloody or tarry looking poop. °MAKE SURE YOU:  °· Understand these instructions. °· Will watch your condition. °· Will get help right away if you are not doing well or get worse. °  °This information is not intended to replace advice given to you by your health care provider. Make sure you discuss any questions you have with your health care provider. °  °Document Released: 09/16/2007 Document Revised: 04/20/2014 Document Reviewed: 12/07/2012 °Elsevier Interactive Patient Education ©2016 Elsevier Inc. ° °

## 2015-06-28 NOTE — ED Provider Notes (Signed)
[facility] Emergency Department Provider Note    ____________________________________________  Time seen: ~1655  I have reviewed the triage vital signs and the nursing notes.   HISTORY  Chief Complaint Flank Pain   History limited by: Not Limited   HPI Eugene Robinson is a 45 y.o. male  Who presents to the emergency department today because of concern for right flank pain. The patient states that he started having pain 3 days ago. It has always been located in the right flank. He states that he went to an urgent care 2 days ago and was put on ciprofloxacin for a UTI. He thinks that he got a little better briefly but then the pain came back. It is now severe. It is stabbing in quality. It does not radiate. He denies any dysuria.   Past Medical History  Diagnosis Date  . WPW (Wolff-Parkinson-White syndrome)   . Chicken pox   . GERD (gastroesophageal reflux disease)   . Kidney stones   . PVC's (premature ventricular contractions) 03/19/2015    Patient Active Problem List   Diagnosis Date Noted  . PVC's (premature ventricular contractions) 03/19/2015  . WPW (Wolff-Parkinson-White syndrome) 11/07/2014  . Obesity (BMI 35.0-39.9 without comorbidity) (HCC) 11/07/2014  . Preventative health care 11/07/2014    Past Surgical History  Procedure Laterality Date  . Adenoidectomy      Current Outpatient Rx  Name  Route  Sig  Dispense  Refill  . ibuprofen (ADVIL,MOTRIN) 200 MG tablet   Oral   Take 200 mg by mouth as needed.         . metoprolol (LOPRESSOR) 50 MG tablet   Oral   Take 1 tablet (50 mg total) by mouth 2 (two) times daily.   60 tablet   11     Allergies Review of patient's allergies indicates no known allergies.  Family History  Problem Relation Age of Onset  . Cancer Mother     lung and breast  . Parkinson's disease Mother     Social History Social History  Substance Use Topics  . Smoking status: Never Smoker   . Smokeless  tobacco: None  . Alcohol Use: No    Review of Systems  Constitutional: Negative for fever. Cardiovascular: Negative for chest pain. Respiratory: Negative for shortness of breath. Gastrointestinal: Positive for right flank pain. Genitourinary: Negative for dysuria. Neurological: Negative for headaches, focal weakness or numbness.  10-point ROS otherwise negative.  ____________________________________________   PHYSICAL EXAM:  VITAL SIGNS: ED Triage Vitals  Enc Vitals Group     BP 06/28/15 1529 140/69 mmHg     Pulse Rate 06/28/15 1529 94     Resp 06/28/15 1529 20     Temp 06/28/15 1529 99.3 F (37.4 C)     Temp src --      SpO2 06/28/15 1529 96 %     Weight 06/28/15 1529 250 lb (113.399 kg)     Height 06/28/15 1529 5\' 10"  (1.778 m)     Head Cir --      Peak Flow --      Pain Score 06/28/15 1528 10   Constitutional: Alert and oriented. Appears uncomfortable. Eyes: Conjunctivae are normal. PERRL. Normal extraocular movements. ENT   Head: Normocephalic and atraumatic.   Nose: No congestion/rhinnorhea.   Mouth/Throat: Mucous membranes are moist.   Neck: No stridor. Hematological/Lymphatic/Immunilogical: No cervical lymphadenopathy. Cardiovascular: Normal rate, regular rhythm.  No murmurs, rubs, or gallops. Respiratory: Normal respiratory effort without tachypnea nor retractions.  Breath sounds are clear and equal bilaterally. No wheezes/rales/rhonchi. Gastrointestinal: Soft and nontender. No distention. Genitourinary: Deferred Musculoskeletal: Normal range of motion in all extremities. No joint effusions.  No lower extremity tenderness nor edema. Neurologic:  Normal speech and language. No gross focal neurologic deficits are appreciated.  Skin:  Skin is warm, dry and intact. No rash noted. Psychiatric: Mood and affect are normal. Speech and behavior are normal. Patient exhibits appropriate insight and judgment.  ____________________________________________     LABS (pertinent positives/negatives)  Labs Reviewed  URINALYSIS COMPLETEWITH MICROSCOPIC ([facility] ONLY) - Abnormal; Notable for the following:    Color, Urine YELLOW (*)    APPearance CLEAR (*)    Hgb urine dipstick 2+ (*)    Protein, ur 100 (*)    Squamous Epithelial / LPF 0-5 (*)    All other components within normal limits  CBC WITH DIFFERENTIAL/PLATELET - Abnormal; Notable for the following:    Neutro Abs 8.1 (*)    Lymphs Abs 0.7 (*)    Monocytes Absolute 1.1 (*)    All other components within normal limits  BASIC METABOLIC PANEL - Abnormal; Notable for the following:    Sodium 134 (*)    Glucose, Bld 118 (*)    Calcium 8.5 (*)    All other components within normal limits    ____________________________________________   EKG  None  ____________________________________________    RADIOLOGY  CT abd/pel IMPRESSION: 1. No acute abdominal or pelvic abnormality. 2. Mild right basilar atelectasis and minimal left basilar atelectasis. 3. Minimal diffuse hepatic steatosis. 4. Moderate-sized sigmoid colon diverticulum. 5. Small umbilical and bilateral inguinal hernias containing fat.  ____________________________________________   PROCEDURES  Procedure(s) performed: None  Critical Care performed: No  ____________________________________________   INITIAL IMPRESSION / ASSESSMENT AND PLAN / ED COURSE  Pertinent labs & imaging results that were available during my care of the patient were reviewed by me and considered in my medical decision making (see chart for details).  Patient presented to the emergency department today because of concerns for right flank pain. On exam patient was minimally tender in that right area. Blood work and urine without any concerning findings. CT scan was performed which again did not show any obvious etiology of the pain. This point I am unclear what is causing the pain however no signs of kidney stones, infection. I do wonder if  early shingles is a possibility. Will plan on discharging home with pain medication and having patient follow up with primary care.  ____________________________________________   FINAL CLINICAL IMPRESSION(S) / ED DIAGNOSES  Right flank pain  Phineas Semen, MD 06/28/15 2035

## 2015-06-30 ENCOUNTER — Encounter: Payer: Self-pay | Admitting: *Deleted

## 2015-06-30 ENCOUNTER — Telehealth: Payer: BLUE CROSS/BLUE SHIELD | Admitting: Family

## 2015-06-30 ENCOUNTER — Ambulatory Visit
Admission: EM | Admit: 2015-06-30 | Discharge: 2015-06-30 | Disposition: A | Discharge status: Home / Self Care | Payer: BLUE CROSS/BLUE SHIELD | Attending: Family Medicine | Admitting: Family Medicine

## 2015-06-30 DIAGNOSIS — B029 Zoster without complications: Secondary | ICD-10-CM | POA: Diagnosis not present

## 2015-06-30 MED ORDER — KETOROLAC TROMETHAMINE 60 MG/2ML IM SOLN
60.0000 mg | Freq: Once | INTRAMUSCULAR | Status: AC
  Administered 2015-06-30: 60 mg via INTRAMUSCULAR

## 2015-06-30 MED ORDER — VALACYCLOVIR HCL 1 G PO TABS: 1000.0000 mg | ORAL_TABLET | Freq: Three times a day (TID) | ORAL | Status: DC

## 2015-06-30 MED ORDER — PROMETHAZINE HCL 25 MG/ML IJ SOLN
25.0000 mg | Freq: Once | INTRAMUSCULAR | Status: AC
  Administered 2015-06-30: 25 mg via INTRAMUSCULAR

## 2015-06-30 NOTE — Progress Notes (Signed)
Based on what you shared with me it looks like you have a serious condition that should be evaluated in a face to face office visit.  If you are having a true medical emergency please call 911.  If you need an urgent face to face visit, [place] has four urgent care centers for your convenience.  . [facility]  [phone] Get Driving Directions Find a Provider at this Location  [address] [place], [place] [address] . 8 am to 8 pm Monday-Friday . 9 am to 7 pm Saturday-Sunday  . [facility]  [phone] Get Driving Directions Find a Provider at this Location  [address], [address] [place], [place] [address] . 8 am to 8 pm Monday-Friday . 9 am to 6 pm Saturday . 11 am to 6 pm Sunday   . [facility] [place]  [phone] Get Driving Directions  [address] [place], [place] [address] . 8 am to 8 pm Monday-Friday . 9 am to 4 pm Saturday-Sunday   . [facility] (a walk in primary care provider)  [phone]  Get Driving Directions Find a Provider at this Location  [address] [place], [place] [address] . 8 am to 8:30 pm Monday-Thursday . 8 am to 6 pm Friday . 8 am to 4 pm Saturday-Sunday   Your e-visit answers were reviewed by a board certified advanced clinical practitioner to complete your personal care plan.  Thank you for using e-Visits. 

## 2015-06-30 NOTE — ED Notes (Addendum)
Pt states that he has had right flank pain that started last week, pain has spread to left flank, last night blisters appeared on right flank.  Pt states that he was seen in the ED Friday and was Dx with shingles.

## 2015-06-30 NOTE — ED Provider Notes (Signed)
CSN: [id number]     Arrival date & time 06/30/15  1306 History   First MD Initiated Contact with Patient 06/30/15 1652     Chief Complaint  Patient presents with  . Rash   (Consider location/radiation/quality/duration/timing/severity/associated sxs/prior Treatment) Patient is a 45 y.o. male presenting with rash.  Rash Location:  Torso Torso rash location:  R flank Quality: blistering and painful   Pain details:    Quality:  Burning   Severity:  Moderate   Onset quality:  Sudden   Duration:  2 days   Timing:  Constant   Progression:  Worsening Severity:  Moderate Onset quality:  Sudden Timing:  Constant Progression:  Worsening Chronicity:  New Context: not animal contact, not chemical exposure, not diapers, not eggs, not exposure to similar rash, not hot tub use, not insect bite/sting, not medications, not new detergent/soap, not nuts and not plant contact   Associated symptoms: nausea   Associated symptoms: no abdominal pain and no fever     Past Medical History  Diagnosis Date  . WPW (Wolff-Parkinson-White syndrome)   . Chicken pox   . GERD (gastroesophageal reflux disease)   . Kidney stones   . PVC's (premature ventricular contractions) 03/19/2015   Past Surgical History  Procedure Laterality Date  . Adenoidectomy     Family History  Problem Relation Age of Onset  . Cancer Mother     lung and breast  . Parkinson's disease Mother    Social History  Substance Use Topics  . Smoking status: Never Smoker   . Smokeless tobacco: None  . Alcohol Use: No    Review of Systems  Constitutional: Negative for fever.  Gastrointestinal: Positive for nausea. Negative for abdominal pain.  Skin: Positive for rash.    Allergies  Review of patient's allergies indicates no known allergies.  Home Medications   Prior to Admission medications   Medication Sig Start Date End Date Taking? Authorizing Provider  ibuprofen (ADVIL,MOTRIN) 200 MG tablet Take 200 mg by mouth every  6 (six) hours as needed.     Historical Provider, MD  metoprolol (LOPRESSOR) 50 MG tablet Take 1 tablet (50 mg total) by mouth 2 (two) times daily. 03/19/15   Duke SalviaSteven C Klein, MD  naproxen (NAPROSYN) 500 MG tablet Take 1 tablet by mouth 2 (two) times daily. 06/26/15   Historical Provider, MD  ondansetron (ZOFRAN) 4 MG tablet Take 1 tablet (4 mg total) by mouth every 8 (eight) hours as needed for nausea or vomiting. 06/28/15   Phineas SemenGraydon Goodman, MD  oxyCODONE-acetaminophen (ROXICET) 5-325 MG tablet Take 1 tablet by mouth every 6 (six) hours as needed for severe pain. 06/28/15   Phineas SemenGraydon Goodman, MD   Meds Ordered and Administered this Visit   Medications  ketorolac (TORADOL) injection 60 mg (60 mg Intramuscular Given 06/30/15 1714)  promethazine (PHENERGAN) injection 25 mg (25 mg Intramuscular Given 06/30/15 1715)    BP 117/66 mmHg  Pulse 74  Temp(Src) 97.9 F (36.6 C) (Oral)  Ht 5\' 10"  (1.778 m)  Wt 250 lb (113.399 kg)  BMI 35.87 kg/m2  SpO2 100% No data found.   Physical Exam  Constitutional: He appears well-developed and well-nourished. No distress.  Abdominal: Soft. Bowel sounds are normal. He exhibits no distension and no mass. There is no tenderness. There is no rebound and no guarding.  Skin: Lesion and rash noted. Rash is vesicular. He is not diaphoretic. There is erythema.     Nursing note and vitals reviewed.   ED Course  Procedures (including critical care time)  Labs Review Labs Reviewed - No data to display  Imaging Review No results found.   Visual Acuity Review  Right Eye Distance:   Left Eye Distance:   Bilateral Distance:    Right Eye Near:   Left Eye Near:    Bilateral Near:         MDM   1. Shingles    Discharge Medication List as of 06/30/2015  5:34 PM    START taking these medications   Details  valACYclovir (VALTREX) 1000 MG tablet Take 1 tablet (1,000 mg total) by mouth 3 (three) times daily., Starting 06/30/2015, Until Discontinued, Normal         1.  diagnosis reviewed with patient 2. Patient given IM phenergan and IM toradol with improvement of symptoms 3. rx as per orders above; reviewed possible side effects, interactions, risks and benefits  4. Recommend supportive treatment with otc analgesics prn 5. Follow-up prn if symptoms worsen or don't improve    Payton Mccallum, MD 07/15/15 2148

## 2015-07-02 ENCOUNTER — Encounter: Payer: Self-pay | Admitting: Emergency Medicine

## 2015-07-02 ENCOUNTER — Ambulatory Visit
Admission: RE | Admit: 2015-07-02 | Discharge: 2015-07-02 | Disposition: A | Discharge status: Home / Self Care | Payer: BLUE CROSS/BLUE SHIELD | Source: Ambulatory Visit | Attending: Family Medicine | Admitting: Family Medicine

## 2015-07-02 ENCOUNTER — Encounter: Payer: Self-pay | Admitting: Family Medicine

## 2015-07-02 ENCOUNTER — Inpatient Hospital Stay
Admission: EM | Admit: 2015-07-02 | Discharge: 2015-07-12 | DRG: 094 | Disposition: A | Discharge status: Home Health | Payer: BLUE CROSS/BLUE SHIELD | Attending: Internal Medicine | Admitting: Internal Medicine

## 2015-07-02 ENCOUNTER — Ambulatory Visit (INDEPENDENT_AMBULATORY_CARE_PROVIDER_SITE_OTHER): Payer: BLUE CROSS/BLUE SHIELD | Admitting: Family Medicine

## 2015-07-02 VITALS — BP 106/72 | HR 79 | Temp 98.6°F | Wt 255.0 lb

## 2015-07-02 DIAGNOSIS — E669 Obesity, unspecified: Secondary | ICD-10-CM | POA: Diagnosis present

## 2015-07-02 DIAGNOSIS — M869 Osteomyelitis, unspecified: Secondary | ICD-10-CM | POA: Diagnosis not present

## 2015-07-02 DIAGNOSIS — G061 Intraspinal abscess and granuloma: Secondary | ICD-10-CM | POA: Diagnosis present

## 2015-07-02 DIAGNOSIS — Z09 Encounter for follow-up examination after completed treatment for conditions other than malignant neoplasm: Secondary | ICD-10-CM

## 2015-07-02 DIAGNOSIS — R109 Unspecified abdominal pain: Secondary | ICD-10-CM | POA: Insufficient documentation

## 2015-07-02 DIAGNOSIS — I456 Pre-excitation syndrome: Secondary | ICD-10-CM | POA: Diagnosis present

## 2015-07-02 DIAGNOSIS — K5903 Drug induced constipation: Secondary | ICD-10-CM | POA: Diagnosis present

## 2015-07-02 DIAGNOSIS — Z6835 Body mass index (BMI) 35.0-35.9, adult: Secondary | ICD-10-CM | POA: Diagnosis not present

## 2015-07-02 DIAGNOSIS — Z79899 Other long term (current) drug therapy: Secondary | ICD-10-CM | POA: Diagnosis not present

## 2015-07-02 DIAGNOSIS — R52 Pain, unspecified: Secondary | ICD-10-CM

## 2015-07-02 DIAGNOSIS — L02818 Cutaneous abscess of other sites: Secondary | ICD-10-CM | POA: Diagnosis present

## 2015-07-02 DIAGNOSIS — J189 Pneumonia, unspecified organism: Secondary | ICD-10-CM | POA: Diagnosis present

## 2015-07-02 DIAGNOSIS — Z820 Family history of epilepsy and other diseases of the nervous system: Secondary | ICD-10-CM | POA: Diagnosis not present

## 2015-07-02 DIAGNOSIS — Z803 Family history of malignant neoplasm of breast: Secondary | ICD-10-CM

## 2015-07-02 DIAGNOSIS — M545 Low back pain: Secondary | ICD-10-CM | POA: Diagnosis not present

## 2015-07-02 DIAGNOSIS — M4620 Osteomyelitis of vertebra, site unspecified: Secondary | ICD-10-CM | POA: Diagnosis not present

## 2015-07-02 DIAGNOSIS — R0781 Pleurodynia: Secondary | ICD-10-CM | POA: Insufficient documentation

## 2015-07-02 DIAGNOSIS — Z801 Family history of malignant neoplasm of trachea, bronchus and lung: Secondary | ICD-10-CM

## 2015-07-02 DIAGNOSIS — R918 Other nonspecific abnormal finding of lung field: Secondary | ICD-10-CM | POA: Insufficient documentation

## 2015-07-02 DIAGNOSIS — J948 Other specified pleural conditions: Secondary | ICD-10-CM | POA: Insufficient documentation

## 2015-07-02 DIAGNOSIS — J869 Pyothorax without fistula: Secondary | ICD-10-CM | POA: Diagnosis present

## 2015-07-02 DIAGNOSIS — I252 Old myocardial infarction: Secondary | ICD-10-CM

## 2015-07-02 DIAGNOSIS — Z87442 Personal history of urinary calculi: Secondary | ICD-10-CM

## 2015-07-02 DIAGNOSIS — K659 Peritonitis, unspecified: Secondary | ICD-10-CM | POA: Insufficient documentation

## 2015-07-02 DIAGNOSIS — Z9889 Other specified postprocedural states: Secondary | ICD-10-CM

## 2015-07-02 DIAGNOSIS — M4624 Osteomyelitis of vertebra, thoracic region: Secondary | ICD-10-CM | POA: Diagnosis present

## 2015-07-02 DIAGNOSIS — B029 Zoster without complications: Secondary | ICD-10-CM

## 2015-07-02 DIAGNOSIS — J9811 Atelectasis: Secondary | ICD-10-CM | POA: Diagnosis present

## 2015-07-02 DIAGNOSIS — B999 Unspecified infectious disease: Secondary | ICD-10-CM

## 2015-07-02 DIAGNOSIS — J90 Pleural effusion, not elsewhere classified: Secondary | ICD-10-CM | POA: Diagnosis not present

## 2015-07-02 DIAGNOSIS — J918 Pleural effusion in other conditions classified elsewhere: Secondary | ICD-10-CM

## 2015-07-02 DIAGNOSIS — K219 Gastro-esophageal reflux disease without esophagitis: Secondary | ICD-10-CM | POA: Diagnosis present

## 2015-07-02 DIAGNOSIS — M549 Dorsalgia, unspecified: Secondary | ICD-10-CM | POA: Diagnosis present

## 2015-07-02 DIAGNOSIS — R7881 Bacteremia: Secondary | ICD-10-CM | POA: Diagnosis not present

## 2015-07-02 DIAGNOSIS — M4640 Discitis, unspecified, site unspecified: Secondary | ICD-10-CM

## 2015-07-02 LAB — CBC
HCT: 40.1 % (ref 40.0–52.0)
Hemoglobin: 13.3 g/dL (ref 13.0–18.0)
MCH: 28.1 pg (ref 26.0–34.0)
MCHC: 33.3 g/dL (ref 32.0–36.0)
MCV: 84.5 fL (ref 80.0–100.0)
PLATELETS: 306 10*3/uL (ref 150–440)
RBC: 4.75 MIL/uL (ref 4.40–5.90)
RDW: 13.7 % (ref 11.5–14.5)
WBC: 13.1 10*3/uL — ABNORMAL HIGH (ref 3.8–10.6)

## 2015-07-02 LAB — COMPREHENSIVE METABOLIC PANEL
ALBUMIN: 3.5 g/dL (ref 3.5–5.2)
ALT: 29 U/L (ref 0–53)
ALT: 32 U/L (ref 17–63)
AST: 23 U/L (ref 0–37)
AST: 29 U/L (ref 15–41)
Albumin: 2.9 g/dL — ABNORMAL LOW (ref 3.5–5.0)
Alkaline Phosphatase: 148 U/L — ABNORMAL HIGH (ref 39–117)
Alkaline Phosphatase: 138 U/L — ABNORMAL HIGH (ref 38–126)
Anion gap: 9 (ref 5–15)
BUN: 17 mg/dL (ref 6–23)
BUN: 21 mg/dL — AB (ref 6–20)
CALCIUM: 7.8 mg/dL — AB (ref 8.9–10.3)
CHLORIDE: 97 meq/L (ref 96–112)
CHLORIDE: 98 mmol/L — AB (ref 101–111)
CO2: 29 meq/L (ref 19–32)
CO2: 25 mmol/L (ref 22–32)
CREATININE: 1 mg/dL (ref 0.40–1.50)
CREATININE: 1.24 mg/dL (ref 0.61–1.24)
Calcium: 8.6 mg/dL (ref 8.4–10.5)
GFR calc Af Amer: >60 mL/min (ref >60)
GFR: 85.25 mL/min (ref >60.00)
Glucose, Bld: 115 mg/dL — ABNORMAL HIGH (ref 70–99)
Glucose, Bld: 99 mg/dL (ref 65–99)
Potassium: 4 mEq/L (ref 3.5–5.1)
Potassium: 3.6 mmol/L (ref 3.5–5.1)
SODIUM: 135 meq/L (ref 135–145)
SODIUM: 132 mmol/L — AB (ref 135–145)
Total Bilirubin: 1.3 mg/dL — ABNORMAL HIGH (ref 0.2–1.2)
Total Bilirubin: 1.2 mg/dL (ref 0.3–1.2)
Total Protein: 7.1 g/dL (ref 6.0–8.3)
Total Protein: 7.4 g/dL (ref 6.5–8.1)

## 2015-07-02 LAB — POCT URINALYSIS DIPSTICK
Bilirubin, UA: NEGATIVE
Glucose, UA: NEGATIVE
Ketones, UA: NEGATIVE
LEUKOCYTES UA: NEGATIVE
Nitrite, UA: NEGATIVE
PH UA: 6.5
PROTEIN UA: 30
RBC UA: NEGATIVE
Spec Grav, UA: 1.02
Urobilinogen, UA: 1

## 2015-07-02 LAB — LIPASE: Lipase: 1 U/L — ABNORMAL LOW (ref 11.0–59.0)

## 2015-07-02 LAB — CBC WITH DIFFERENTIAL/PLATELET
BASOS ABS: 0.1 10*3/uL (ref 0.0–0.1)
BASOS PCT: 0.4 % (ref 0.0–3.0)
EOS ABS: 0.1 10*3/uL (ref 0.0–0.7)
Eosinophils Relative: 1 % (ref 0.0–5.0)
HCT: 43.8 % (ref 39.0–52.0)
HEMOGLOBIN: 14.7 g/dL (ref 13.0–17.0)
Lymphocytes Relative: 7.2 % — ABNORMAL LOW (ref 12.0–46.0)
Lymphs Abs: 1.1 10*3/uL (ref 0.7–4.0)
MCHC: 33.6 g/dL (ref 30.0–36.0)
MCV: 85.1 fl (ref 78.0–100.0)
MONO ABS: 1.1 10*3/uL — AB (ref 0.1–1.0)
Monocytes Relative: 7.6 % (ref 3.0–12.0)
Neutro Abs: 12.7 10*3/uL — ABNORMAL HIGH (ref 1.4–7.7)
Neutrophils Relative %: 83.8 % — ABNORMAL HIGH (ref 43.0–77.0)
PLATELETS: 365 10*3/uL (ref 150.0–400.0)
RBC: 5.14 Mil/uL (ref 4.22–5.81)
RDW: 14 % (ref 11.5–15.5)
WBC: 15.1 10*3/uL — AB (ref 4.0–10.5)

## 2015-07-02 MED ORDER — PIPERACILLIN-TAZOBACTAM 3.375 G IVPB 30 MIN
3.3750 g | Freq: Once | INTRAVENOUS | Status: AC
  Administered 2015-07-02: 3.375 g via INTRAVENOUS
  Filled 2015-07-02: qty 50

## 2015-07-02 MED ORDER — SODIUM CHLORIDE 0.9 % IV BOLUS (SEPSIS)
1000.0000 mL | Freq: Once | INTRAVENOUS | Status: AC
  Administered 2015-07-02: 1000 mL via INTRAVENOUS

## 2015-07-02 MED ORDER — KETOROLAC TROMETHAMINE 60 MG/2ML IM SOLN
60.0000 mg | Freq: Once | INTRAMUSCULAR | Status: AC
  Administered 2015-07-02: 60 mg via INTRAMUSCULAR

## 2015-07-02 MED ORDER — IOPAMIDOL (ISOVUE-370) INJECTION 76%
100.0000 mL | Freq: Once | INTRAVENOUS | Status: AC | PRN
  Administered 2015-07-02: 100 mL via INTRAVENOUS

## 2015-07-02 MED ORDER — VANCOMYCIN HCL IN DEXTROSE 1-5 GM/200ML-% IV SOLN
1000.0000 mg | Freq: Once | INTRAVENOUS | Status: AC
  Administered 2015-07-02: 1000 mg via INTRAVENOUS
  Filled 2015-07-02: qty 200

## 2015-07-02 MED ORDER — MORPHINE SULFATE (PF) 4 MG/ML IV SOLN
4.0000 mg | INTRAVENOUS | Status: DC | PRN
  Administered 2015-07-02 – 2015-07-10 (×12): 4 mg via INTRAVENOUS
  Filled 2015-07-02 (×13): qty 1

## 2015-07-02 NOTE — ED Provider Notes (Signed)
[facility] Emergency Department Provider Note  Time seen: 9:00 PM  I have reviewed the triage vital signs and the nursing notes.   HISTORY  Chief Complaint Flank Pain    HPI Eugene Robinson is a 45 y.o. male with a past medical history of gastric reflux, WPW, presents to the emergency department with back pain, abdominal distention, constipation, sent from CT scan to the ER. According to the patient approximately 5-6 days ago he developed abdominal pain, constipation and significant back pain. He was seen in the emergency department 4 days ago had a CT scan performed which did not show any acute abnormality, was prescribed a short course of pain medication and follow-up with his primary care physician. Patient states symptoms seem to worsen with significant mid central back pain. He went to the urgent care and diagnosed with a possible urinary tract infection but states they did not see enough to prescribe antibiotics and told him to also follow-up with his primary care doctor. Patient saw his primary care doctor who ordered an outpatient CT abdomen/pelvis once again. Patient was at CT scan today and was sent to the emergency department due to the results of the CT. Patient denies fever. Denies cough. Denies congestion. States he actually had a bowel movement today for first time in 5 days. But continues to feel significant back pain. Patient states he has been diagnosed with shingles in the past several days as well.     Past Medical History  Diagnosis Date  . WPW (Wolff-Parkinson-White syndrome)   . Chicken pox   . GERD (gastroesophageal reflux disease)   . Kidney stones   . PVC's (premature ventricular contractions) 03/19/2015    Patient Active Problem List   Diagnosis Date Noted  . Back pain 07/02/2015  . Right flank pain 07/02/2015  . PVC's (premature ventricular contractions) 03/19/2015  . WPW (Wolff-Parkinson-White syndrome) 11/07/2014  . Obesity (BMI  35.0-39.9 without comorbidity) (HCC) 11/07/2014  . Preventative health care 11/07/2014    Past Surgical History  Procedure Laterality Date  . Adenoidectomy      Current Outpatient Rx  Name  Route  Sig  Dispense  Refill  . ibuprofen (ADVIL,MOTRIN) 200 MG tablet   Oral   Take 200 mg by mouth as needed.         . metoprolol (LOPRESSOR) 50 MG tablet   Oral   Take 1 tablet (50 mg total) by mouth 2 (two) times daily.   60 tablet   11   . ondansetron (ZOFRAN) 4 MG tablet   Oral   Take 1 tablet (4 mg total) by mouth every 8 (eight) hours as needed for nausea or vomiting.   20 tablet   0   . oxyCODONE-acetaminophen (ROXICET) 5-325 MG tablet   Oral   Take 1 tablet by mouth every 6 (six) hours as needed for severe pain.   12 tablet   0   . valACYclovir (VALTREX) 1000 MG tablet   Oral   Take 1 tablet (1,000 mg total) by mouth 3 (three) times daily.   21 tablet   0     Allergies Review of patient's allergies indicates no known allergies.  Family History  Problem Relation Age of Onset  . Cancer Mother     lung and breast  . Parkinson's disease Mother     Social History Social History  Substance Use Topics  . Smoking status: Never Smoker   . Smokeless tobacco: None  . Alcohol Use:  No    Review of Systems Constitutional: Negative for fever Cardiovascular: Negative for chest pain. Respiratory: Negative for shortness of breath.Negative for cough Gastrointestinal: Mild abdominal pain, moderate distention. Negative for nausea, vomiting, diarrhea. Positive for constipation until today. Genitourinary: Negative for dysuria. Musculoskeletal: Significant central mid back pain Skin: Rash to right flank diagnosed as shingles. Neurological: Negative for headache 10-point ROS otherwise negative.  ____________________________________________   PHYSICAL EXAM:  VITAL SIGNS: ED Triage Vitals  Enc Vitals Group     BP 07/02/15 1824 115/68 mmHg     Pulse Rate 07/02/15  1824 80     Resp 07/02/15 1824 16     Temp 07/02/15 1824 98.1 F (36.7 C)     Temp Source 07/02/15 1824 Oral     SpO2 07/02/15 1824 96 %     Weight 07/02/15 1824 255 lb (115.667 kg)     Height 07/02/15 1824 5\' 10"  (1.778 m)     Head Cir --      Peak Flow --      Pain Score 07/02/15 1825 4     Pain Loc --      Pain Edu? --      Excl. in GC? --     Constitutional: Alert and oriented. Well appearing and in no distress. Eyes: Normal exam ENT   Head: Normocephalic and atraumatic   Mouth/Throat: Mucous membranes are moist. Cardiovascular: Normal rate, regular rhythm. No murmur Respiratory: Normal respiratory effort without tachypnea nor retractions. Breath sounds are clear Gastrointestinal: Soft and nontender. No distention. Musculoskeletal: Nontender with normal range of motion in all extremities. Moderate mid back pain tenderness palpation over the spine. Patient has a vesicular lesion in the right flank most consistent with herpes zoster, appears to fit a dermatomal pattern. Neurologic:  Normal speech and language. No gross focal neurologic deficits Skin:  Skin is warm, dry and intact. Vesicular rash as above. Psychiatric: Mood and affect are normal. Speech and behavior are normal.  ____________________________________________   RADIOLOGY  CT consistent with retrocrural infection/inflammation, possible bilateral lower lobe pneumonia  ____________________________________________    INITIAL IMPRESSION / ASSESSMENT AND PLAN / ED COURSE  Pertinent labs & imaging results that were available during my care of the patient were reviewed by me and considered in my medical decision making (see chart for details).  Patient presents with back pain approximately 5-6 days. CT scan shows retrocrural infection. CT also shows possible bilateral lower lobe pneumonias. Patient denies cough, congestion, fever. Patient does state significant back pain in the area which the CT shows  infection. We will check labs, chest x-ray, blood cultures, urinalysis, urine culture began broad-spectrum antibiotics and likely admitted to the hospital for further treatment.  ____________________________________________   FINAL CLINICAL IMPRESSION(S) / ED DIAGNOSES  Retrocrural infection Shingles   Minna AntisKevin Syndey Jaskolski, MD 07/02/15 2203

## 2015-07-02 NOTE — Progress Notes (Signed)
Pre visit review using our clinic review tool, if applicable. No additional management support is needed unless otherwise documented below in the visit note. 

## 2015-07-02 NOTE — Progress Notes (Signed)
Subjective:  Patient ID: ISSAM CARLYON, male    DOB: May 03, 1970  Age: 45 y.o. MRN: [id number]  CC: Flank pain, constipation  HPI:  45 year old male presents to the clinic today with the above complaints.  Flank pain, constipation  Patient states that he's not been feeling well since Wednesday of last week.  Patient states on Wednesday he developed right flank pain and went to local urgent care.  He states that he was evaluated and told that he likely had a kidney infection. He was placed on Cipro.  Patient continued to have significant pain and was seen in the emergency department on 3/17. Labs and imaging were unremarkable at that time. He did have some findings suggestive of possible herpes zoster and he was was given pain medication and told to follow-up with his primary physician.  Patient once again continued to do poorly and had severe pain. He went to urgent care on 3/19 and was placed on Valtrex.  Patient presents today reporting severe R flank/back pain. He also reports severe constipation as he's been taking narcotics for his pain. He has not had a bowel movement in a week.  He reports associated nausea. No vomiting. Decreased PO intake. He is hydrating well.  He is in severe pain and is concerned given his lack of improvement.  No recent fever or chills. He denies a history of abdominal surgery.  Social Hx   Social History   Social History  . Marital Status: Married    Spouse Name: N/A  . Number of Children: N/A  . Years of Education: N/A   Social History Main Topics  . Smoking status: Never Smoker   . Smokeless tobacco: None  . Alcohol Use: No  . Drug Use: No  . Sexual Activity: Not Asked   Other Topics Concern  . None   Social History Narrative   Review of Systems  Constitutional: Negative for fever.  Gastrointestinal: Positive for nausea, abdominal pain, constipation and abdominal distention.  Genitourinary: Positive for flank pain.    Musculoskeletal: Positive for back pain.   Objective:  BP 106/72 mmHg  Pulse 79  Temp(Src) 98.6 F (37 C) (Oral)  Wt 255 lb (115.667 kg)  SpO2 93%  BP/Weight 07/02/2015 07/02/2015 7/90/3833  Systolic BP 383 291 916  Diastolic BP 68 72 66  Wt. (Lbs) 255 255 250  BMI 36.59 36.59 35.87   Physical Exam  Constitutional: He is oriented to person, place, and time.  Patient appears ill and distressed. He is sitting in wheelchair.  HENT:  Head: Normocephalic and atraumatic.  Cardiovascular: Normal rate and regular rhythm.   Pulmonary/Chest: Effort normal.  No rales, rhonchi, wheezing appreciated.  Abdominal:  Abdomen is distended. Minimal borborygmi. Tender palpation diffusely secondary to distention.  Neurological: He is alert and oriented to person, place, and time.  Skin:  Right low back - raised, erythematous raised. Small bullae noted as well.  Vitals reviewed.   Lab Results  Component Value Date   WBC 15.1* 07/02/2015   HGB 14.7 07/02/2015   HCT 43.8 07/02/2015   PLT 365.0 07/02/2015   GLUCOSE 115* 07/02/2015   CHOL 129 11/14/2014   TRIG 109.0 11/14/2014   HDL 33.50* 11/14/2014   LDLCALC 74 11/14/2014   ALT 29 07/02/2015   AST 23 07/02/2015   NA 135 07/02/2015   K 4.0 07/02/2015   CL 97 07/02/2015   CREATININE 1.00 07/02/2015   BUN 17 07/02/2015   CO2 29 07/02/2015  HGBA1C 5.4 11/14/2014   Ct Abdomen Pelvis W Contrast  07/02/2015  CLINICAL DATA:  Generalized abdominal pain and distension for 7 days, constipation with nausea and vomiting, current diagnosis of shingles EXAM: CT ABDOMEN AND PELVIS WITH CONTRAST TECHNIQUE: Multidetector CT imaging of the abdomen and pelvis was performed using the standard protocol following bolus administration of intravenous contrast. CONTRAST:  100 mL Isovue 370 COMPARISON:  06/28/2015 FINDINGS: Lower chest: Trace left pleural effusion. Small right pleural effusion. Both pleural effusions are larger when compared to the prior  examination. Dependent consolidation also mildly worse bilaterally. Hepatobiliary: No significant abnormalities Pancreas: Fatty infiltration of the pancreas with no acute findings Spleen: Spleen is normal Adrenals/Urinary Tract: Adrenal glands are normal. Kidneys are normal. No hydronephrosis. Bladder is normal. Stomach/Bowel: Nonobstructive bowel gas pattern. There are fluid contents into the distal descending colon suggesting a diarrhea type illness. Stool is retained in the ascending colon. Appendix is normal. Small bowel and stomach are normal. Vascular/Lymphatic: No vascular abnormalities. No significant adenopathy. There are small retroperitoneal periaortic lymph nodes well within normal limits for size. Reproductive: Negative Other: There is no ascites. Musculoskeletal: From T10 through T11 and T12, there is increased soft tissue attenuation anterior to the vertebral bodies in the retrocrural region, also involving the right paraspinous retrocrural region. The adjacent vertebral bodies demonstrate degenerative disc disease with anterior osteophyte at T11-12, but no evidence of periosteal reaction or loss cortex to suggest osteomyelitis. IMPRESSION: Inflammation in the retrocrural space, worse compared to 06/28/15. Adjacent vertebral bodies appear normal, as does the aorta. Differential diagnosis possibilities include aortitis, diskitis, or seeding from infection in the lungs. There is increased bilateral pleural effusion and increased bilateral lower lobe infiltrate, right more than left, and pneumonia is not excluded. Consider CT thorax to fully image this process. These results will be called to the ordering clinician or representative by the Radiology Department at the imaging location. Electronically Signed   By: Skipper Cliche M.D.   On: 07/02/2015 17:54   Ct Abdomen Pelvis W Contrast  06/28/2015  CLINICAL DATA:  Right flank pain radiating to the right lower quadrant of the abdomen for the past 3  days. EXAM: CT ABDOMEN AND PELVIS WITH CONTRAST TECHNIQUE: Multidetector CT imaging of the abdomen and pelvis was performed using the standard protocol following bolus administration of intravenous contrast. CONTRAST:  121m OMNIPAQUE IOHEXOL 300 MG/ML  SOLN COMPARISON:  None. FINDINGS: Lower chest: Mild dependent atelectasis at the right lung base and minimal dependent atelectasis at the left lung base. Hepatobiliary: Minimal diffuse low density of the liver relative to the spleen. Normal appearing gallbladder. Pancreas: Mild diffuse atrophy with fatty replacement. Spleen: Within normal limits in size and appearance. Adrenals/Urinary Tract: No masses identified. No evidence of hydronephrosis. Stomach/Bowel: Moderate-sized sigmoid colon diverticulum. Normal appearing appendix. No gastric or small bowel abnormalities. Vascular/Lymphatic: No pathologically enlarged lymph nodes. No evidence of abdominal aortic aneurysm. Reproductive: No mass or other significant abnormality. Other: Small umbilical hernia containing fat. Small bilateral inguinal hernias containing fat, larger on the left. Musculoskeletal: Lumbar and lower thoracic spine degenerative changes. IMPRESSION: 1. No acute abdominal or pelvic abnormality. 2. Mild right basilar atelectasis and minimal left basilar atelectasis. 3. Minimal diffuse hepatic steatosis. 4. Moderate-sized sigmoid colon diverticulum. 5. Small umbilical and bilateral inguinal hernias containing fat. Electronically Signed   By: SClaudie ReveringM.D.   On: 06/28/2015 20:27    Assessment & Plan:   Problem List Items Addressed This Visit    Right flank pain  Relevant Medications   ketorolac (TORADOL) injection 60 mg (Completed)   Other Relevant Orders   DG Abd 1 View   CBC w/Diff (Completed)   Comp Met (CMET) (Completed)   Lipase (Completed)   POCT Urinalysis Dipstick (Completed)   CT Abdomen Pelvis W Contrast (Completed)   Back pain - Primary    New problem. Patient with  acute new onset right low back and right flank pain. Patient also has distention likely secondary to constipation from opiate use. Urinalysis was negative. Patient appears ill today. Had a discussion with the patient and the wife about workup. He did not want to go to the emergency room. They preferred that I start with a workup. Given symptoms and exam, laboratory workup was obtained. CBC revealed elevated white blood cell count at 15.1 with left shift. Mildly elevated alkaline phosphatase and bilirubin. CT was obtained again. It revealed several findings: Pleural effusions and infiltrates were noted suggesting pneumonia. He also has significant retrocrural inflammation suggestive of possible discitis, aortitis, or hematogenous seeding of infection from the chest. Patient was notified of her other results while he was in [place]. Patient going to emergency department for additional assessment and possible CT of the chest prior to admission. Patient needs admission for IV antibiotics and further workup regarding the retrocrural inflammation.       Relevant Medications   ketorolac (TORADOL) injection 60 mg (Completed)      Meds ordered this encounter  Medications  . ketorolac (TORADOL) injection 60 mg    Sig:     Follow-up: PRN  [place]

## 2015-07-02 NOTE — ED Notes (Addendum)
Pt sent from CT by Dr Adriana Simasook; had a CT scan and sent here for eval. Pt with right sided flank pain x1 week; was seen here earlier in the week and d/c with shingles. Pt reports he hasn't had a bowel movement in 1 week; reports nausea, denies vomiting. Pt's CT results reports inflammation in the retrocrucal space and inflammation in the lungs.

## 2015-07-02 NOTE — Assessment & Plan Note (Addendum)
New problem. Patient with acute new onset right low back and right flank pain. Patient also has distention likely secondary to constipation from opiate use. Urinalysis was negative. Patient appears ill today. Had a discussion with the patient and the wife about workup. He did not want to go to the emergency room. They preferred that I start with a workup. Given symptoms and exam, laboratory workup was obtained. CBC revealed elevated white blood cell count at 15.1 with left shift. Mildly elevated alkaline phosphatase and bilirubin. CT was obtained again. It revealed several findings: Pleural effusions and infiltrates were noted suggesting pneumonia. He also has significant retrocrural inflammation suggestive of possible discitis, aortitis, or hematogenous seeding of infection from the chest. Patient was notified of her other results while he was in [place]. Patient going to emergency department for additional assessment and possible CT of the chest prior to admission. Patient needs admission for IV antibiotics and further workup regarding the retrocrural inflammation.

## 2015-07-02 NOTE — H&P (Signed)
[facility] - [place] at [facility]   PATIENT NAME: Eugene Robinson    MR#:  [id number]  DATE OF BIRTH:  11-03-1970  DATE OF ADMISSION:  07/02/2015  PRIMARY CARE PHYSICIAN: Tommie Sams, DO   REQUESTING/REFERRING PHYSICIAN: Lenard Lance, MD  CHIEF COMPLAINT:   Chief Complaint  Patient presents with  . Flank Pain    HISTORY OF PRESENT ILLNESS:  Eugene Robinson  is a 45 y.o. male who presents with back pain. Patient states that this pain started 4-5 days ago. He came to the ED for evaluation and had a CT abdomen that time that was read as within normal limits. He then went to an urgent care as the pain persisted, and it was felt that he might have shingles. He then went to his primary care physician felt that there is something more going on, and sent him for repeat CT abdomen, which the patient had today. Repeat imaging shows what is likely basilar pneumonia and also an area of retrocrucal inflammation. This may be due to seeding from what is possibly his pulmonary infection, or some other inflammatory process. Differential speculated possibly aortitis or discitis. Patient was given IV antibiotics and hospitalists were called for admission.  PAST MEDICAL HISTORY:   Past Medical History  Diagnosis Date  . WPW (Wolff-Parkinson-White syndrome)   . Chicken pox   . GERD (gastroesophageal reflux disease)   . Kidney stones   . PVC's (premature ventricular contractions) 03/19/2015    PAST SURGICAL HISTORY:   Past Surgical History  Procedure Laterality Date  . Adenoidectomy      SOCIAL HISTORY:   Social History  Substance Use Topics  . Smoking status: Never Smoker   . Smokeless tobacco: Not on file  . Alcohol Use: No    FAMILY HISTORY:   Family History  Problem Relation Age of Onset  . Cancer Mother     lung and breast  . Parkinson's disease Mother     DRUG ALLERGIES:  No Known Allergies  MEDICATIONS AT HOME:   Prior to Admission medications    Medication Sig Start Date End Date Taking? Authorizing Provider  ciprofloxacin (CIPRO) 500 MG tablet Take 1 tablet by mouth 2 (two) times daily. 06/26/15  Yes Historical Provider, MD  ibuprofen (ADVIL,MOTRIN) 200 MG tablet Take 200 mg by mouth every 6 (six) hours as needed.    Yes Historical Provider, MD  metoprolol (LOPRESSOR) 50 MG tablet Take 1 tablet (50 mg total) by mouth 2 (two) times daily. 03/19/15  Yes Duke Salvia, MD  naproxen (NAPROSYN) 500 MG tablet Take 1 tablet by mouth 2 (two) times daily. 06/26/15  Yes Historical Provider, MD  ondansetron (ZOFRAN) 4 MG tablet Take 1 tablet (4 mg total) by mouth every 8 (eight) hours as needed for nausea or vomiting. 06/28/15  Yes Phineas Semen, MD  oxyCODONE-acetaminophen (ROXICET) 5-325 MG tablet Take 1 tablet by mouth every 6 (six) hours as needed for severe pain. 06/28/15  Yes Phineas Semen, MD  valACYclovir (VALTREX) 1000 MG tablet Take 1 tablet (1,000 mg total) by mouth 3 (three) times daily. 06/30/15  Yes Payton Mccallum, MD    REVIEW OF SYSTEMS:  Review of Systems  Constitutional: Negative for fever, chills, weight loss and malaise/fatigue.  HENT: Negative for ear pain, hearing loss and tinnitus.   Eyes: Negative for blurred vision, double vision, pain and redness.  Respiratory: Negative for cough, hemoptysis and shortness of breath.   Cardiovascular: Negative for chest pain, palpitations, orthopnea  and leg swelling.  Gastrointestinal: Negative for nausea, vomiting, abdominal pain, diarrhea and constipation.  Genitourinary: Negative for dysuria, frequency and hematuria.  Musculoskeletal: Positive for back pain. Negative for joint pain and neck pain.  Skin:       No acne, rash, or lesions  Neurological: Negative for dizziness, tremors, focal weakness and weakness.  Endo/Heme/Allergies: Negative for polydipsia. Does not bruise/bleed easily.  Psychiatric/Behavioral: Negative for depression. The patient is not nervous/anxious and does not  have insomnia.      VITAL SIGNS:   Filed Vitals:   07/02/15 1824 07/02/15 2100 07/02/15 2140  BP: 115/68 105/54   Pulse: 80 70 74  Temp: 98.1 F (36.7 C)    TempSrc: Oral    Resp: 16    Height:  (1.778 m)    Weight: 115.667 kg (255 lb)    SpO2: 96% 98% 96%   Wt Readings from Last 3 Encounters:  07/02/15 115.667 kg (255 lb)  07/02/15 115.667 kg (255 lb)  06/30/15 113.399 kg (250 lb)    PHYSICAL EXAMINATION:  Physical Exam  Vitals reviewed. Constitutional: He is oriented to person, place, and time. He appears well-developed and well-nourished. No distress.  HENT:  Head: Normocephalic and atraumatic.  Mouth/Throat: Oropharynx is clear and moist.  Eyes: Conjunctivae and EOM are normal. Pupils are equal, round, and reactive to light. No scleral icterus.  Neck: Normal range of motion. Neck supple. No JVD present. No thyromegaly present.  Cardiovascular: Normal rate, regular rhythm and intact distal pulses.  Exam reveals no gallop and no friction rub.   No murmur heard. Respiratory: Effort normal and breath sounds normal. No respiratory distress. He has no wheezes. He has no rales.  GI: Soft. Bowel sounds are normal. He exhibits no distension. There is no tenderness.  Musculoskeletal: Normal range of motion. He exhibits no edema.  No arthritis, no gout  Lymphadenopathy:    He has no cervical adenopathy.  Neurological: He is alert and oriented to person, place, and time. No cranial nerve deficit.  No dysarthria, no aphasia  Skin: Skin is warm and dry. No rash noted. No erythema.  Psychiatric: He has a normal mood and affect. His behavior is normal. Judgment and thought content normal.    LABORATORY PANEL:   CBC  Recent Labs Lab 07/02/15 2108  WBC 13.1*  HGB 13.3  HCT 40.1  PLT 306   ------------------------------------------------------------------------------------------------------------------  Chemistries   Recent Labs Lab 07/02/15 1447  NA 135  K  4.0  CL 97  CO2 29  GLUCOSE 115*  BUN 17  CREATININE 1.00  CALCIUM 8.6  AST 23  ALT 29  ALKPHOS 148*  BILITOT 1.3*   ------------------------------------------------------------------------------------------------------------------  Cardiac Enzymes No results for input(s): TROPONINI in the last 168 hours. ------------------------------------------------------------------------------------------------------------------  RADIOLOGY:  Ct Abdomen Pelvis W Contrast  07/02/2015  CLINICAL DATA:  Generalized abdominal pain and distension for 7 days, constipation with nausea and vomiting, current diagnosis of shingles EXAM: CT ABDOMEN AND PELVIS WITH CONTRAST TECHNIQUE: Multidetector CT imaging of the abdomen and pelvis was performed using the standard protocol following bolus administration of intravenous contrast. CONTRAST:  100 mL Isovue 370 COMPARISON:  06/28/2015 FINDINGS: Lower chest: Trace left pleural effusion. Small right pleural effusion. Both pleural effusions are larger when compared to the prior examination. Dependent consolidation also mildly worse bilaterally. Hepatobiliary: No significant abnormalities Pancreas: Fatty infiltration of the pancreas with no acute findings Spleen: Spleen is normal Adrenals/Urinary Tract: Adrenal glands are normal. Kidneys are normal. No hydronephrosis.  Bladder is normal. Stomach/Bowel: Nonobstructive bowel gas pattern. There are fluid contents into the distal descending colon suggesting a diarrhea type illness. Stool is retained in the ascending colon. Appendix is normal. Small bowel and stomach are normal. Vascular/Lymphatic: No vascular abnormalities. No significant adenopathy. There are small retroperitoneal periaortic lymph nodes well within normal limits for size. Reproductive: Negative Other: There is no ascites. Musculoskeletal: From T10 through T11 and T12, there is increased soft tissue attenuation anterior to the vertebral bodies in the retrocrural  region, also involving the right paraspinous retrocrural region. The adjacent vertebral bodies demonstrate degenerative disc disease with anterior osteophyte at T11-12, but no evidence of periosteal reaction or loss cortex to suggest osteomyelitis. IMPRESSION: Inflammation in the retrocrural space, worse compared to 06/28/15. Adjacent vertebral bodies appear normal, as does the aorta. Differential diagnosis possibilities include aortitis, diskitis, or seeding from infection in the lungs. There is increased bilateral pleural effusion and increased bilateral lower lobe infiltrate, right more than left, and pneumonia is not excluded. Consider CT thorax to fully image this process. These results will be called to the ordering clinician or representative by the Radiology Department at the imaging location. Electronically Signed   By: Esperanza Heiraymond  Rubner M.D.   On: 07/02/2015 17:54    EKG:   Orders placed or performed in visit on 03/19/15  . EKG 12-Lead    IMPRESSION AND PLAN:  Principal Problem:   CAP (community acquired pneumonia) - IV antibiotics ordered in the ED. We will continue this broad coverage upfront on admission with vancomycin and Zosyn. Cultures were sent from the ED. Patient does not have classic clinical symptoms of pneumonia. Denies any fever or chills, cough, sputum production. His oxygen saturations are good. We'll monitor him for expected improvement. Active Problems:   Back pain - inflammation in the retrocrural area. Potentially related to above presumed infection. Monitor for improvement, control pain when necessary.   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 We'll keep him on telemetry while he is admitted here. Continue home rate controlling medications.   GERD (gastroesophageal reflux disease) -not on home meds for this, treat when necessary  All the records are reviewed and case discussed with ED provider. Management plans  discussed with the patient and/or family.  DVT PROPHYLAXIS: SubQ lovenox  GI PROPHYLAXIS: None  ADMISSION STATUS: Inpatient  CODE STATUS: Full Code Status History    This patient does not have a recorded code status. Please follow your organizational policy for patients in this situation.      TOTAL TIME TAKING CARE OF THIS PATIENT: 45 minutes.    Akaya Proffit FIELDING 07/02/2015, 10:46 PM  [facility]  Office  [phone]  CC: Primary care physician; Tommie SamsJayce G Cook, DO

## 2015-07-02 NOTE — ED Notes (Signed)
Pt c/o constipation r/t opioid use.  Pt sts today was his first BM in 7 days.  Pt denies n/v/d, SOB, CP or LOC.  Pt A/O x 4. NAD.

## 2015-07-03 ENCOUNTER — Inpatient Hospital Stay (HOSPITAL_COMMUNITY)
Admit: 2015-07-03 | Discharge: 2015-07-03 | Disposition: A | Discharge status: Home / Self Care | Payer: BLUE CROSS/BLUE SHIELD | Attending: Internal Medicine | Admitting: Internal Medicine

## 2015-07-03 ENCOUNTER — Inpatient Hospital Stay: Payer: BLUE CROSS/BLUE SHIELD

## 2015-07-03 DIAGNOSIS — R7881 Bacteremia: Secondary | ICD-10-CM

## 2015-07-03 LAB — URINALYSIS COMPLETE WITH MICROSCOPIC (ARMC ONLY)
Bacteria, UA: NONE SEEN
Bilirubin Urine: NEGATIVE
GLUCOSE, UA: NEGATIVE mg/dL
LEUKOCYTES UA: NEGATIVE
Nitrite: NEGATIVE
PROTEIN: 30 mg/dL — AB
Specific Gravity, Urine: 1.048 — ABNORMAL HIGH (ref 1.005–1.030)
pH: 5 (ref 5.0–8.0)

## 2015-07-03 LAB — BASIC METABOLIC PANEL
Anion gap: 6 (ref 5–15)
BUN: 20 mg/dL (ref 6–20)
CO2: 27 mmol/L (ref 22–32)
Calcium: 7.6 mg/dL — ABNORMAL LOW (ref 8.9–10.3)
Chloride: 101 mmol/L (ref 101–111)
Creatinine, Ser: 1.18 mg/dL (ref 0.61–1.24)
Glucose, Bld: 102 mg/dL — ABNORMAL HIGH (ref 65–99)
POTASSIUM: 3.7 mmol/L (ref 3.5–5.1)
SODIUM: 134 mmol/L — AB (ref 135–145)

## 2015-07-03 LAB — RAPID HIV SCREEN (HIV 1/2 AB+AG)
HIV 1/2 ANTIBODIES: NONREACTIVE
HIV-1 P24 Antigen - HIV24: NONREACTIVE

## 2015-07-03 LAB — CBC
HEMATOCRIT: 37.5 % — AB (ref 40.0–52.0)
Hemoglobin: 12.7 g/dL — ABNORMAL LOW (ref 13.0–18.0)
MCH: 28.6 pg (ref 26.0–34.0)
MCHC: 33.9 g/dL (ref 32.0–36.0)
MCV: 84.3 fL (ref 80.0–100.0)
PLATELETS: 299 10*3/uL (ref 150–440)
RBC: 4.45 MIL/uL (ref 4.40–5.90)
RDW: 13.5 % (ref 11.5–14.5)
WBC: 12 10*3/uL — AB (ref 3.8–10.6)

## 2015-07-03 LAB — SEDIMENTATION RATE: Sed Rate: 68 mm/hr — ABNORMAL HIGH (ref 0–15)

## 2015-07-03 MED ORDER — ACETAMINOPHEN 650 MG RE SUPP: 650.0000 mg | Freq: Four times a day (QID) | RECTAL | Status: DC | PRN

## 2015-07-03 MED ORDER — SODIUM CHLORIDE 0.9% FLUSH
3.0000 mL | Freq: Two times a day (BID) | INTRAVENOUS | Status: DC
  Administered 2015-07-03 – 2015-07-11 (×10): 3 mL via INTRAVENOUS

## 2015-07-03 MED ORDER — GADOBENATE DIMEGLUMINE 529 MG/ML IV SOLN
20.0000 mL | Freq: Once | INTRAVENOUS | Status: AC | PRN
  Administered 2015-07-03: 20 mL via INTRAVENOUS

## 2015-07-03 MED ORDER — INFLUENZA VAC SPLIT QUAD 0.5 ML IM SUSY
0.5000 mL | PREFILLED_SYRINGE | INTRAMUSCULAR | Status: AC
  Administered 2015-07-04: 0.5 mL via INTRAMUSCULAR
  Filled 2015-07-03: qty 0.5

## 2015-07-03 MED ORDER — SENNOSIDES-DOCUSATE SODIUM 8.6-50 MG PO TABS
2.0000 | ORAL_TABLET | Freq: Two times a day (BID) | ORAL | Status: DC
  Administered 2015-07-03 – 2015-07-11 (×13): 2 via ORAL
  Filled 2015-07-03 (×15): qty 2

## 2015-07-03 MED ORDER — PIPERACILLIN-TAZOBACTAM 4.5 G IVPB
4.5000 g | Freq: Three times a day (TID) | INTRAVENOUS | Status: DC
  Administered 2015-07-03: 4.5 g via INTRAVENOUS
  Filled 2015-07-03 (×3): qty 100

## 2015-07-03 MED ORDER — VANCOMYCIN HCL 10 G IV SOLR
1500.0000 mg | Freq: Two times a day (BID) | INTRAVENOUS | Status: DC
  Administered 2015-07-03: 1500 mg via INTRAVENOUS
  Filled 2015-07-03 (×2): qty 1500

## 2015-07-03 MED ORDER — SODIUM CHLORIDE 0.9 % IV SOLN
INTRAVENOUS | Status: DC
  Administered 2015-07-03: 02:00:00 via INTRAVENOUS

## 2015-07-03 MED ORDER — VALACYCLOVIR HCL 500 MG PO TABS
1000.0000 mg | ORAL_TABLET | Freq: Three times a day (TID) | ORAL | Status: DC
  Administered 2015-07-03 – 2015-07-08 (×16): 1000 mg via ORAL
  Filled 2015-07-03 (×16): qty 2

## 2015-07-03 MED ORDER — BISACODYL 5 MG PO TBEC
10.0000 mg | DELAYED_RELEASE_TABLET | Freq: Two times a day (BID) | ORAL | Status: AC
  Administered 2015-07-03 (×2): 10 mg via ORAL
  Filled 2015-07-03 (×2): qty 2

## 2015-07-03 MED ORDER — ONDANSETRON HCL 4 MG PO TABS
4.0000 mg | ORAL_TABLET | Freq: Four times a day (QID) | ORAL | Status: DC | PRN
  Administered 2015-07-07: 4 mg via ORAL
  Filled 2015-07-03: qty 1

## 2015-07-03 MED ORDER — LORAZEPAM 2 MG/ML IJ SOLN
1.0000 mg | Freq: Once | INTRAMUSCULAR | Status: AC
  Administered 2015-07-03: 1 mg via INTRAVENOUS
  Filled 2015-07-03: qty 1

## 2015-07-03 MED ORDER — ACETAMINOPHEN 325 MG PO TABS
650.0000 mg | ORAL_TABLET | Freq: Four times a day (QID) | ORAL | Status: DC | PRN
  Administered 2015-07-05 – 2015-07-06 (×3): 650 mg via ORAL
  Filled 2015-07-03 (×4): qty 2

## 2015-07-03 MED ORDER — METOPROLOL TARTRATE 50 MG PO TABS
50.0000 mg | ORAL_TABLET | Freq: Two times a day (BID) | ORAL | Status: DC
  Administered 2015-07-03 – 2015-07-12 (×20): 50 mg via ORAL
  Filled 2015-07-03 (×20): qty 1

## 2015-07-03 MED ORDER — OXYCODONE-ACETAMINOPHEN 5-325 MG PO TABS
1.0000 | ORAL_TABLET | Freq: Four times a day (QID) | ORAL | Status: DC | PRN
  Administered 2015-07-03 – 2015-07-10 (×10): 1 via ORAL
  Filled 2015-07-03 (×11): qty 1

## 2015-07-03 MED ORDER — ENOXAPARIN SODIUM 40 MG/0.4ML ~~LOC~~ SOLN
40.0000 mg | SUBCUTANEOUS | Status: DC
  Administered 2015-07-03: 40 mg via SUBCUTANEOUS

## 2015-07-03 MED ORDER — ONDANSETRON HCL 4 MG/2ML IJ SOLN: 4.0000 mg | Freq: Four times a day (QID) | INTRAMUSCULAR | Status: DC | PRN

## 2015-07-03 NOTE — Progress Notes (Signed)
Pharmacy Antibiotic Note  Eugene Robinson Reason is a 45 y.o. male admitted on 07/02/2015 with pneumonia.  Pharmacy has been consulted for vancomycin and Zosyn dosing.  Plan: DW 90kg  Vd 63L kei 0.83 hr-1  t1/2 8 hours Vancomycin 1500 mg q 12 hours ordered. Level before 5th overall dose. Goal trough 15-20.  Height: 5\' 10"  (177.8 cm) Weight: 255 lb 6.4 oz (115.849 kg) IBW/kg (Calculated) : 73  Temp (24hrs), Avg:98.3 F (36.8 C), Min:98.1 F (36.7 C), Max:98.6 F (37 C)   Recent Labs Lab 06/28/15 1545 07/02/15 1447 07/02/15 2108  WBC 10.2 15.1* 13.1*  CREATININE 1.01 1.00 1.24    Estimated Creatinine Clearance: 94.9 mL/min (by C-G formula based on Cr of 1.24).    No Known Allergies  Antimicrobials this admission: vancomycin zosyn >>    >>   Dose adjustments this admission:   Microbiology results: 3/21 BCx: pending 3//22 UCx: pending   3/22 UA: (-)   Thank you for allowing pharmacy to be a part of this patient's care.  Eugene Robinson S 07/03/2015 3:02 AM

## 2015-07-03 NOTE — Progress Notes (Signed)
[facility] - [place] at [facility]   PATIENT NAME: Eugene Robinson    MR#:  [id number]  DATE OF BIRTH:  [birth year]  SUBJECTIVE:  CHIEF COMPLAINT:   Chief Complaint  Patient presents with  . Flank Pain   Admitted for back and flank pain. Started on vancomycin and Zosyn. No recent procedures.  No weakness or tingling in lower extremity's.  Has been constipated with a small stool yesterday.  REVIEW OF SYSTEMS:    Review of Systems  Constitutional: Positive for fever, chills and malaise/fatigue.  HENT: Negative for sore throat.   Eyes: Negative for blurred vision, double vision and pain.  Respiratory: Negative for cough, hemoptysis, shortness of breath and wheezing.   Cardiovascular: Negative for chest pain, palpitations, orthopnea and leg swelling.  Gastrointestinal: Positive for constipation. Negative for heartburn, nausea, vomiting, abdominal pain and diarrhea.  Genitourinary: Negative for dysuria and hematuria.  Musculoskeletal: Positive for back pain. Negative for joint pain.  Skin: Negative for rash.  Neurological: Positive for weakness. Negative for sensory change, speech change, focal weakness and headaches.  Endo/Heme/Allergies: Does not bruise/bleed easily.  Psychiatric/Behavioral: Negative for depression. The patient is not nervous/anxious.     DRUG ALLERGIES:  No Known Allergies  VITALS:  Blood pressure 127/66, pulse 76, temperature 98.9 F (37.2 C), temperature source Oral, resp. rate 19, height  (1.778 m), weight 115.849 kg (255 lb 6.4 oz), SpO2 93 %.  PHYSICAL EXAMINATION:   Physical Exam  GENERAL:  45 y.o.-year-old patient lying in the bed with  Distress due to back pain EYES: Pupils equal, round, reactive to light and accommodation. No scleral icterus. Extraocular muscles intact.  HEENT: Head atraumatic, normocephalic. Oropharynx and nasopharynx clear.  NECK:  Supple, no jugular venous distention. No thyroid enlargement, no  tenderness.  LUNGS: Normal breath sounds bilaterally, no wheezing, rales, rhonchi. No use of accessory muscles of respiration.  CARDIOVASCULAR: S1, S2 normal. No murmurs, rubs, or gallops.  ABDOMEN: Soft, nontender, nondistended. Bowel sounds present. No organomegaly or mass.  EXTREMITIES: No cyanosis, clubbing or edema b/l.    NEUROLOGIC: Cranial nerves II through XII are intact. No focal Motor or sensory deficits b/l.   PSYCHIATRIC: The patient is alert and oriented x 3.  SKIN: No obvious rash, lesion, or ulcer.   Lower thoracic point spinal tenderness. No swelling or redness  LABORATORY PANEL:   CBC  Recent Labs Lab 07/03/15 0326  WBC 12.0*  HGB 12.7*  HCT 37.5*  PLT 299   ------------------------------------------------------------------------------------------------------------------ Chemistries   Recent Labs Lab 07/02/15 2108 07/03/15 0326  NA 132* 134*  K 3.6 3.7  CL 98* 101  CO2 25 27  GLUCOSE 99 102*  BUN 21* 20  CREATININE 1.24 1.18  CALCIUM 7.8* 7.6*  AST 29  --   ALT 32  --   ALKPHOS 138*  --   BILITOT 1.2  --    ------------------------------------------------------------------------------------------------------------------  Cardiac Enzymes No results for input(s): TROPONINI in the last 168 hours. ------------------------------------------------------------------------------------------------------------------  RADIOLOGY:  Mr Thoracic Spine W Wo Contrast  07/03/2015  CLINICAL DATA:  Knee back pain for 4-5 days. Retrocrural inflammation seen on CT scan abdomen and pelvis yesterday. Likely right basilar pneumonia. Initial encounter. EXAM: MRI THORACIC SPINE WITHOUT AND WITH CONTRAST TECHNIQUE: Multiplanar and multiecho pulse sequences of the thoracic spine were obtained without and with intravenous contrast. CONTRAST:  20 mL MULTIHANCE GADOBENATE DIMEGLUMINE 529 MG/ML IV SOLN COMPARISON:  CT abdomen and pelvis 06/28/2015 and 07/02/2015. FINDINGS:  Vertebral body height  and alignment are maintained. Marrow edema and enhancement are seen throughout the T11 vertebral body and in the anterior, superior endplate of T12. Extensive paraspinous stranding is identified. A fluid collection anterior and to the right is centered about the T11 vertebral body measures approximately 3.0 cm transverse by 4.5 cm craniocaudal by 1.4 cm AP. The collection extends from the anterior aspect of the T10-11 disc interspace to the anterior margin of T11-12 and is consistent with abscess. There is mild irregularity of the T11-12 endplates. No epidural abscess is identified. The thoracic cord demonstrates normal signal throughout. The central canal and foramina appear open at all levels. Paraspinous structures demonstrate small bilateral pleural effusions, greater on the right, and right basilar airspace disease. 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 Osteomyelitis appears worst in T11 where there is abnormal signal and enhancement throughout the vertebral body. Rim enhancing fluid collection consistent with abscess anterior and to the left is centered about T11 and extends from the T10-11 level to the T12-L1 level. No epidural abscess is identified. Small bilateral pleural effusions, worse on the right and right basilar airspace disease worrisome for pneumonia. Critical Value/emergent results were called by telephone at the time of interpretation on 07/03/2015 at 10:44 am to Dr. Milagros Loll , who verbally acknowledged these results. Electronically Signed   By: Drusilla Kanner M.D.   On: 07/03/2015 10:45   Ct Abdomen Pelvis W Contrast  07/02/2015  CLINICAL DATA:  Generalized abdominal pain and distension for 7 days, constipation with nausea and vomiting, current diagnosis of shingles EXAM: CT ABDOMEN AND PELVIS WITH CONTRAST TECHNIQUE: Multidetector CT imaging of the abdomen and pelvis was performed using the standard protocol following  bolus administration of intravenous contrast. CONTRAST:  100 mL Isovue 370 COMPARISON:  06/28/2015 FINDINGS: Lower chest: Trace left pleural effusion. Small right pleural effusion. Both pleural effusions are larger when compared to the prior examination. Dependent consolidation also mildly worse bilaterally. Hepatobiliary: No significant abnormalities Pancreas: Fatty infiltration of the pancreas with no acute findings Spleen: Spleen is normal Adrenals/Urinary Tract: Adrenal glands are normal. Kidneys are normal. No hydronephrosis. Bladder is normal. Stomach/Bowel: Nonobstructive bowel gas pattern. There are fluid contents into the distal descending colon suggesting a diarrhea type illness. Stool is retained in the ascending colon. Appendix is normal. Small bowel and stomach are normal. Vascular/Lymphatic: No vascular abnormalities. No significant adenopathy. There are small retroperitoneal periaortic lymph nodes well within normal limits for size. Reproductive: Negative Other: There is no ascites. Musculoskeletal: From T10 through T11 and T12, there is increased soft tissue attenuation anterior to the vertebral bodies in the retrocrural region, also involving the right paraspinous retrocrural region. The adjacent vertebral bodies demonstrate degenerative disc disease with anterior osteophyte at T11-12, but no evidence of periosteal reaction or loss cortex to suggest osteomyelitis. IMPRESSION: Inflammation in the retrocrural space, worse compared to 06/28/15. Adjacent vertebral bodies appear normal, as does the aorta. Differential diagnosis possibilities include aortitis, diskitis, or seeding from infection in the lungs. There is increased bilateral pleural effusion and increased bilateral lower lobe infiltrate, right more than left, and pneumonia is not excluded. Consider CT thorax to fully image this process. These results will be called to the ordering clinician or representative by the Radiology Department at the  imaging location. Electronically Signed   By: Esperanza Heir M.D.   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 Discussed  Dr. Sampson GoonFitzgerald of infectious disease. Will need interventional radiology for biopsy/drain. Blood cultures pending. No recent procedures, dental work, artificial cardiac valves. Check echocardiogram  *  Wolff-Parkinson-White syndrome is stable  * DVT prophylaxis with Lovenox  All the records are reviewed and case discussed with Care Management/Social Workerr. Management plans discussed with the patient, family and they are in agreement.  CODE STATUS: FULL CODE  DVT Prophylaxis: SCDs  TOTAL TIME TAKING CARE OF THIS PATIENT: 40 minutes.   POSSIBLE D/C IN 4-5 DAYS, DEPENDING ON CLINICAL CONDITION.  Milagros LollSudini, Nocole Zammit R M.D on 07/03/2015 at 12:31 PM  Between 7am to 6pm - Pager - [phone]  After 6pm go to [email or web address] - password [email or web address]  [facility]  Office  [phone]  CC: Primary care physician; Tommie SamsJayce G Cook, DO  Note: This dictation was prepared with Dragon dictation along with smaller phrase technology. Any transcriptional errors that result from this process are unintentional.

## 2015-07-03 NOTE — Consult Note (Addendum)
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 male with hx WPW, Jerrye Bushy and prior kidney stone (at age 15) admitted 3/21 with back and flank pain for 4-5 days.  He was seen in ED 3/17 with these complaints mainly on R flank.  He had gone to a [facility] a few days prior and told he had UTI and placed on cipro but he had no dysuria, change in urine odor or color. His wife says they told him he had protein in his urine and that is why they started the antibiotics .  In ED UA was neg with 0-5 wbc and cx not done.  His wbc at that time was  10.2.  CT scan was negative of abx except for some R basilar atelectasis.  He then went to [facility] and told had shingles and started valtrex 3/19.   His PCP saw him 3/21 and repeat CT with this showing pleural effusions and infiltrates and possible discitis.  Since admission was started on vanco and zosyn and MRI reveals T11 osteo and an ant abscess near the aorta. Glascock and UCX done and negative. WBC on admit was 15.1  He has not had fevers since admit, nor was he having any prior He denies any recent dental work, dental infections, skin infections, GI or GU procedures.  No TB exposure, foreign travel or [employer].  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 Married, 29 year old child at home. No sick contacts  Past Medical History  Diagnosis Date  . WPW (Wolff-Parkinson-White syndrome)   . Chicken pox   . GERD (gastroesophageal reflux disease)   . Kidney stones   . PVC's (premature ventricular contractions) 03/19/2015   Past Surgical History  Procedure Laterality Date  . Adenoidectomy     Social History  Substance Use Topics  . Smoking status: Never Smoker    . Smokeless tobacco: None  . Alcohol Use: No   Family History  Problem Relation Age of Onset  . Cancer Mother     lung and breast  . Parkinson's disease Mother     Allergies: No Known Allergies  Current antibiotics: Antibiotics Given (last 72 hours)    Date/Time Action Medication Dose Rate   07/03/15 0513 Given   vancomycin (VANCOCIN) 1,500 mg in sodium chloride 0.9 % 500 mL IVPB 1,500 mg 250 mL/hr   07/03/15 4742 Given   piperacillin-tazobactam (ZOSYN) IVPB 4.5 g 4.5 g 25 mL/hr   07/03/15 0820 Given   valACYclovir (VALTREX) tablet 1,000 mg 1,000 mg       MEDICATIONS: . bisacodyl  10 mg Oral BID  . enoxaparin (LOVENOX) injection  40 mg Subcutaneous Q24H  . [START ON 07/04/2015] Influenza vac split quadrivalent PF  0.5 mL Intramuscular Tomorrow-1000  . metoprolol  50 mg Oral BID  . senna-docusate  2 tablet Oral BID  . sodium chloride flush  3 mL Intravenous Q12H  . valACYclovir  1,000 mg Oral TID    Review of Systems - 11 systems reviewed and negative per HPI   OBJECTIVE: Temp:  [98.1 F (36.7 C)-98.9 F (37.2 C)] 98.4 F (36.9 C) (03/22 1238) Pulse Rate:  [70-89] 73 (03/22 1238)  Resp:  [16-20] 18 (03/22 1238) BP: (105-149)/(54-81) 122/66 mmHg (03/22 1238) SpO2:  [91 %-98 %] 95 % (03/22 1238) Weight:  [115.667 kg (255 lb)-115.849 kg (255 lb 6.4 oz)] 115.849 kg (255 lb 6.4 oz) (03/22 0153) Physical Exam  Constitutional: He is oriented to person, place, and time. Obese, in pain HENT: Perrla eomi, anicteric Mouth/Throat: Oropharynx is clear and dry. No oropharyngeal exudate.  Cardiovascular: Normal rate, regular rhythm and normal heart sounds.  Pulmonary/Chest: Effort normal and breath sounds normal. Dec BS bases Abdominal: distended. Bowel sounds are normal. There is no tenderness.  Lymphadenopathy: He has no cervical adenopathy.  Neurological: He is alert and oriented to person, place, and time.  Skin: Skin is warm and dry. R back with one large blister (2 cm)  and several healing spots No stigmata of endocarditis Psychiatric: He has a normal mood and affect. His behavior is normal.  EXT no cce   LABS: Results for orders placed or performed during the hospital encounter of 07/02/15 (from the past 48 hour(s))  CBC     Status: Abnormal   Collection Time: 07/02/15  9:08 PM  Result Value Ref Range   WBC 13.1 (H) 3.8 - 10.6 K/uL   RBC 4.75 4.40 - 5.90 MIL/uL   Hemoglobin 13.3 13.0 - 18.0 g/dL   HCT 40.1 40.0 - 52.0 %   MCV 84.5 80.0 - 100.0 fL   MCH 28.1 26.0 - 34.0 pg   MCHC 33.3 32.0 - 36.0 g/dL   RDW 13.7 11.5 - 14.5 %   Platelets 306 150 - 440 K/uL  Comprehensive metabolic panel     Status: Abnormal   Collection Time: 07/02/15  9:08 PM  Result Value Ref Range   Sodium 132 (L) 135 - 145 mmol/L   Potassium 3.6 3.5 - 5.1 mmol/L   Chloride 98 (L) 101 - 111 mmol/L   CO2 25 22 - 32 mmol/L   Glucose, Bld 99 65 - 99 mg/dL   BUN 21 (H) 6 - 20 mg/dL   Creatinine, Ser 1.24 0.61 - 1.24 mg/dL   Calcium 7.8 (L) 8.9 - 10.3 mg/dL   Total Protein 7.4 6.5 - 8.1 g/dL   Albumin 2.9 (L) 3.5 - 5.0 g/dL   AST 29 15 - 41 U/L   ALT 32 17 - 63 U/L   Alkaline Phosphatase 138 (H) 38 - 126 U/L   Total Bilirubin 1.2 0.3 - 1.2 mg/dL   GFR calc non Af Amer >60 >60 mL/min   GFR calc Af Amer >60 >60 mL/min    Comment: (NOTE) The eGFR has been calculated using the CKD EPI equation. This calculation has not been validated in all clinical situations. eGFR's persistently <60 mL/min signify possible Chronic Kidney Disease.    Anion gap 9 5 - 15  Blood culture (routine x 2)     Status: None (Preliminary result)   Collection Time: 07/02/15  9:08 PM  Result Value Ref Range   Specimen Description BLOOD RIGHT HAND    Special Requests      BOTTLES DRAWN AEROBIC AND ANAEROBIC 5MLANAEROBIC, 10MLAEROBIC   Culture NO GROWTH < 24 HOURS    Report Status PENDING   Blood culture (routine x 2)     Status: None (Preliminary result)   Collection Time: 07/02/15  9:08 PM   Result Value Ref Range   Specimen Description BLOOD RIGHT ANTECUBITAL    Special Requests BOTTLES DRAWN AEROBIC AND ANAEROBIC 5ML    Culture NO GROWTH < 24  HOURS    Report Status PENDING   Urinalysis complete, with microscopic ([facility] only)     Status: Abnormal   Collection Time: 07/03/15  2:19 AM  Result Value Ref Range   Color, Urine YELLOW (A) YELLOW   APPearance CLEAR (A) CLEAR   Glucose, UA NEGATIVE NEGATIVE mg/dL   Bilirubin Urine NEGATIVE NEGATIVE   Ketones, ur TRACE (A) NEGATIVE mg/dL   Specific Gravity, Urine 1.048 (H) 1.005 - 1.030   Hgb urine dipstick 1+ (A) NEGATIVE   pH 5.0 5.0 - 8.0   Protein, ur 30 (A) NEGATIVE mg/dL   Nitrite NEGATIVE NEGATIVE   Leukocytes, UA NEGATIVE NEGATIVE   RBC / HPF 0-5 0 - 5 RBC/hpf   WBC, UA 0-5 0 - 5 WBC/hpf   Bacteria, UA NONE SEEN NONE SEEN   Squamous Epithelial / LPF 0-5 (A) NONE SEEN   Mucous PRESENT    Ca Oxalate Crys, UA PRESENT   Urine culture     Status: None (Preliminary result)   Collection Time: 07/03/15  2:19 AM  Result Value Ref Range   Specimen Description URINE, CLEAN CATCH    Special Requests NONE    Culture NO GROWTH < 12 HOURS    Report Status PENDING   Basic metabolic panel     Status: Abnormal   Collection Time: 07/03/15  3:26 AM  Result Value Ref Range   Sodium 134 (L) 135 - 145 mmol/L   Potassium 3.7 3.5 - 5.1 mmol/L   Chloride 101 101 - 111 mmol/L   CO2 27 22 - 32 mmol/L   Glucose, Bld 102 (H) 65 - 99 mg/dL   BUN 20 6 - 20 mg/dL   Creatinine, Ser 1.18 0.61 - 1.24 mg/dL   Calcium 7.6 (L) 8.9 - 10.3 mg/dL   GFR calc non Af Amer >60 >60 mL/min   GFR calc Af Amer >60 >60 mL/min    Comment: (NOTE) The eGFR has been calculated using the CKD EPI equation. This calculation has not been validated in all clinical situations. eGFR's persistently <60 mL/min signify possible Chronic Kidney Disease.    Anion gap 6 5 - 15  CBC     Status: Abnormal   Collection Time: 07/03/15  3:26 AM  Result Value Ref Range    WBC 12.0 (H) 3.8 - 10.6 K/uL   RBC 4.45 4.40 - 5.90 MIL/uL   Hemoglobin 12.7 (L) 13.0 - 18.0 g/dL   HCT 37.5 (L) 40.0 - 52.0 %   MCV 84.3 80.0 - 100.0 fL   MCH 28.6 26.0 - 34.0 pg   MCHC 33.9 32.0 - 36.0 g/dL   RDW 13.5 11.5 - 14.5 %   Platelets 299 150 - 440 K/uL   No components found for: ESR, C REACTIVE PROTEIN MICRO: Recent Results (from the past 720 hour(s))  Blood culture (routine x 2)     Status: None (Preliminary result)   Collection Time: 07/02/15  9:08 PM  Result Value Ref Range Status   Specimen Description BLOOD RIGHT HAND  Final   Special Requests   Final    BOTTLES DRAWN AEROBIC AND ANAEROBIC 5MLANAEROBIC, 10MLAEROBIC   Culture NO GROWTH < 24 HOURS  Final   Report Status PENDING  Incomplete  Blood culture (routine x 2)     Status: None (Preliminary result)   Collection Time: 07/02/15  9:08 PM  Result Value Ref Range Status   Specimen Description BLOOD RIGHT ANTECUBITAL  Final   Special Requests BOTTLES DRAWN AEROBIC AND  ANAEROBIC 5ML  Final   Culture NO GROWTH < 24 HOURS  Final   Report Status PENDING  Incomplete  Urine culture     Status: None (Preliminary result)   Collection Time: 07/03/15  2:19 AM  Result Value Ref Range Status   Specimen Description URINE, CLEAN CATCH  Final   Special Requests NONE  Final   Culture NO GROWTH < 12 HOURS  Final   Report Status PENDING  Incomplete    IMAGING: Mr Thoracic Spine W Wo Contrast  07/03/2015  CLINICAL DATA:  Knee back pain for 4-5 days. Retrocrural inflammation seen on CT scan abdomen and pelvis yesterday. Likely right basilar pneumonia. Initial encounter. EXAM: MRI THORACIC SPINE WITHOUT AND WITH CONTRAST TECHNIQUE: Multiplanar and multiecho pulse sequences of the thoracic spine were obtained without and with intravenous contrast. CONTRAST:  20 mL MULTIHANCE GADOBENATE DIMEGLUMINE 529 MG/ML IV SOLN COMPARISON:  CT abdomen and pelvis 06/28/2015 and 07/02/2015. FINDINGS: Vertebral body height and alignment are  maintained. Marrow edema and enhancement are seen throughout the T11 vertebral body and in the anterior, superior endplate of V95. Extensive paraspinous stranding is identified. A fluid collection anterior and to the right is centered about the T11 vertebral body measures approximately 3.0 cm transverse by 4.5 cm craniocaudal by 1.4 cm AP. The collection extends from the anterior aspect of the T10-11 disc interspace to the anterior margin of T11-12 and is consistent with abscess. There is mild irregularity of the T11-12 endplates. No epidural abscess is identified. The thoracic cord demonstrates normal signal throughout. The central canal and foramina appear open at all levels. Paraspinous structures demonstrate small bilateral pleural effusions, greater on the right, and right basilar airspace disease. 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 Osteomyelitis appears worst in T11 where there is abnormal signal and enhancement throughout the vertebral body. Rim enhancing fluid collection consistent with abscess anterior and to the left is centered about T11 and extends from the T10-11 level to the T12-L1 level. No epidural abscess is identified. Small bilateral pleural effusions, worse on the right and right basilar airspace disease worrisome for pneumonia. Critical Value/emergent results were called by telephone at the time of interpretation on 07/03/2015 at 10:44 am to Dr. Hillary Bow , who verbally acknowledged these results. Electronically Signed   By: Inge Rise M.D.   On: 07/03/2015 10:45   Ct Abdomen Pelvis W Contrast  07/02/2015  CLINICAL DATA:  Generalized abdominal pain and distension for 7 days, constipation with nausea and vomiting, current diagnosis of shingles EXAM: CT ABDOMEN AND PELVIS WITH CONTRAST TECHNIQUE: Multidetector CT imaging of the abdomen and pelvis was performed using the standard protocol following bolus administration of intravenous  contrast. CONTRAST:  100 mL Isovue 370 COMPARISON:  06/28/2015 FINDINGS: Lower chest: Trace left pleural effusion. Small right pleural effusion. Both pleural effusions are larger when compared to the prior examination. Dependent consolidation also mildly worse bilaterally. Hepatobiliary: No significant abnormalities Pancreas: Fatty infiltration of the pancreas with no acute findings Spleen: Spleen is normal Adrenals/Urinary Tract: Adrenal glands are normal. Kidneys are normal. No hydronephrosis. Bladder is normal. Stomach/Bowel: Nonobstructive bowel gas pattern. There are fluid contents into the distal descending colon suggesting a diarrhea type illness. Stool is retained in the ascending colon. Appendix is normal. Small bowel and stomach are normal. Vascular/Lymphatic: No vascular abnormalities. No significant adenopathy. There are small retroperitoneal periaortic lymph nodes well within normal limits for size. Reproductive: Negative Other: There is no ascites. Musculoskeletal: From T10 through  T11 and T12, there is increased soft tissue attenuation anterior to the vertebral bodies in the retrocrural region, also involving the right paraspinous retrocrural region. The adjacent vertebral bodies demonstrate degenerative disc disease with anterior osteophyte at T11-12, but no evidence of periosteal reaction or loss cortex to suggest osteomyelitis. IMPRESSION: Inflammation in the retrocrural space, worse compared to 06/28/15. Adjacent vertebral bodies appear normal, as does the aorta. Differential diagnosis possibilities include aortitis, diskitis, or seeding from infection in the lungs. There is increased bilateral pleural effusion and increased bilateral lower lobe infiltrate, right more than left, and pneumonia is not excluded. Consider CT thorax to fully image this process. These results will be called to the ordering clinician or representative by the Radiology Department at the imaging location. Electronically  Signed   By: Skipper Cliche M.D.   On: 07/02/2015 17:54   Ct Abdomen Pelvis W Contrast  06/28/2015  CLINICAL DATA:  Right flank pain radiating to the right lower quadrant of the abdomen for the past 3 days. EXAM: CT ABDOMEN AND PELVIS WITH CONTRAST TECHNIQUE: Multidetector CT imaging of the abdomen and pelvis was performed using the standard protocol following bolus administration of intravenous contrast. CONTRAST:  120m OMNIPAQUE IOHEXOL 300 MG/ML  SOLN COMPARISON:  None. FINDINGS: Lower chest: Mild dependent atelectasis at the right lung base and minimal dependent atelectasis at the left lung base. Hepatobiliary: Minimal diffuse low density of the liver relative to the spleen. Normal appearing gallbladder. Pancreas: Mild diffuse atrophy with fatty replacement. Spleen: Within normal limits in size and appearance. Adrenals/Urinary Tract: No masses identified. No evidence of hydronephrosis. Stomach/Bowel: Moderate-sized sigmoid colon diverticulum. Normal appearing appendix. No gastric or small bowel abnormalities. Vascular/Lymphatic: No pathologically enlarged lymph nodes. No evidence of abdominal aortic aneurysm. Reproductive: No mass or other significant abnormality. Other: Small umbilical hernia containing fat. Small bilateral inguinal hernias containing fat, larger on the left. Musculoskeletal: Lumbar and lower thoracic spine degenerative changes. IMPRESSION: 1. No acute abdominal or pelvic abnormality. 2. Mild right basilar atelectasis and minimal left basilar atelectasis. 3. Minimal diffuse hepatic steatosis. 4. Moderate-sized sigmoid colon diverticulum. 5. Small umbilical and bilateral inguinal hernias containing fat. Electronically Signed   By: SClaudie ReveringM.D.   On: 06/28/2015 20:27    Assessment:   Eugene LUMADUEis a 45y.o. 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  He presents acutely over about 5-7 days with no obvious predisposing source of infection.  His bcx so  far are negative.   I have reviewed MRI with radiology and discussed the case with patient and his wife.  I would suggest holding further abx as long as he is stable and on Friday attempting aspiration of the phlegmon for routine cx and AFB.  This will greatly assist in his treatment as he will need long term 6-8 weeks of IV abx.  The patient and his wife agreed to this plan.  We will place PICC once bcx neg x 48 hours Check esr ,crp and HIV Check Echo  Thank you very much for allowing me to participate in the care of this patient. Please call with questions.   DCheral Marker FOla Spurr MD

## 2015-07-03 NOTE — Care Management (Signed)
Patient presents with back pain.  Imaging showing evidence of pneumonia and there is concern for T11 and T12 vertebral osteomyelitis with abscess.  Has been discussed with ID and it is documented patient will need interventional radiology for biopsy/drain of this site.  There could possibly be a need for long term IV antibiotic therapy.

## 2015-07-03 NOTE — ED Notes (Signed)
Pt de-sated to 88%, sound asleep, placed on 2L [place], sating 98%. Pt NAD

## 2015-07-03 NOTE — Plan of Care (Signed)
Problem: Activity: Goal: Ability to tolerate increased activity will improve Outcome: Progressing Patient pain increases with movement due to his back pain  Problem: Education: Goal: Knowledge of disease or condition will improve Outcome: Completed/Met Date Met:  07/03/15 Handouts given to patient.

## 2015-07-03 NOTE — Progress Notes (Signed)
Order received from Dr. Elpidio AnisSudini to give patient 1 mg of ativan d/t anxiety during MRI. One time does, order placed. Trudee KusterBrandi R Mansfield

## 2015-07-04 LAB — CBC WITH DIFFERENTIAL/PLATELET
BASOS ABS: 0 10*3/uL (ref 0–0.1)
Basophils Relative: 0 %
Eosinophils Absolute: 0.2 10*3/uL (ref 0–0.7)
Eosinophils Relative: 2 %
HEMATOCRIT: 38.9 % — AB (ref 40.0–52.0)
Hemoglobin: 13.3 g/dL (ref 13.0–18.0)
LYMPHS PCT: 7 %
Lymphs Abs: 1 10*3/uL (ref 1.0–3.6)
MCH: 29 pg (ref 26.0–34.0)
MCHC: 34.1 g/dL (ref 32.0–36.0)
MCV: 85 fL (ref 80.0–100.0)
MONO ABS: 1.2 10*3/uL — AB (ref 0.2–1.0)
Monocytes Relative: 9 %
NEUTROS ABS: 11.5 10*3/uL — AB (ref 1.4–6.5)
Neutrophils Relative %: 82 %
Platelets: 338 10*3/uL (ref 150–440)
RBC: 4.57 MIL/uL (ref 4.40–5.90)
RDW: 13.8 % (ref 11.5–14.5)
WBC: 14 10*3/uL — ABNORMAL HIGH (ref 3.8–10.6)

## 2015-07-04 LAB — URINE CULTURE: CULTURE: NO GROWTH

## 2015-07-04 LAB — C-REACTIVE PROTEIN: CRP: 18.3 mg/dL — AB (ref <1.0)

## 2015-07-04 LAB — PROTIME-INR
INR: 1.31
Prothrombin Time: 16.4 seconds — ABNORMAL HIGH (ref 11.4–15.0)

## 2015-07-04 LAB — ECHOCARDIOGRAM COMPLETE
HEIGHTINCHES: 70 in
Weight: 4086.4 oz

## 2015-07-04 MED ORDER — ENOXAPARIN SODIUM 40 MG/0.4ML ~~LOC~~ SOLN
40.0000 mg | SUBCUTANEOUS | Status: DC
  Administered 2015-07-05 – 2015-07-07 (×3): 40 mg via SUBCUTANEOUS
  Filled 2015-07-04 (×3): qty 0.4

## 2015-07-04 NOTE — Progress Notes (Signed)
[facility] - [place] at [facility]   PATIENT NAME: Eugene Robinson    MR#:  [id number]  DATE OF BIRTH:  1971-02-09  SUBJECTIVE:  CHIEF COMPLAINT:   Chief Complaint  Patient presents with  . Flank Pain   Admitted for back and flank pain. Started on vancomycin and Zosyn.  No weakness or tingling in lower extremity's. Constipation resolved  IV antibiotics stopped while waiting for prevertebral abscess drainage  REVIEW OF SYSTEMS:    Review of Systems  Constitutional: Positive for fever, chills and malaise/fatigue.  HENT: Negative for sore throat.   Eyes: Negative for blurred vision, double vision and pain.  Respiratory: Negative for cough, hemoptysis, shortness of breath and wheezing.   Cardiovascular: Negative for chest pain, palpitations, orthopnea and leg swelling.  Gastrointestinal: Positive for constipation. Negative for heartburn, nausea, vomiting, abdominal pain and diarrhea.  Genitourinary: Negative for dysuria and hematuria.  Musculoskeletal: Positive for back pain. Negative for joint pain.  Skin: Negative for rash.  Neurological: Positive for weakness. Negative for sensory change, speech change, focal weakness and headaches.  Endo/Heme/Allergies: Does not bruise/bleed easily.  Psychiatric/Behavioral: Negative for depression. The patient is not nervous/anxious.     DRUG ALLERGIES:  No Known Allergies  VITALS:  Blood pressure 129/62, pulse 79, temperature 98.5 F (36.9 C), temperature source Oral, resp. rate 14, height  (1.778 m), weight 115.849 kg (255 lb 6.4 oz), SpO2 94 %.  PHYSICAL EXAMINATION:   Physical Exam  GENERAL:  45 y.o.-year-old patient lying in the bed with  Distress due to back pain EYES: Pupils equal, round, reactive to light and accommodation. No scleral icterus. Extraocular muscles intact.  HEENT: Head atraumatic, normocephalic. Oropharynx and nasopharynx clear.  NECK:  Supple, no jugular venous distention. No  thyroid enlargement, no tenderness.  LUNGS: Normal breath sounds bilaterally, no wheezing, rales, rhonchi. No use of accessory muscles of respiration.  CARDIOVASCULAR: S1, S2 normal. No murmurs, rubs, or gallops.  ABDOMEN: Soft, nontender, nondistended. Bowel sounds present. No organomegaly or mass.  EXTREMITIES: No cyanosis, clubbing or edema b/l.    NEUROLOGIC: Cranial nerves II through XII are intact. No focal Motor or sensory deficits b/l.   PSYCHIATRIC: The patient is alert and oriented x 3.  SKIN: No obvious rash, lesion, or ulcer.   Lower thoracic point spinal tenderness. No swelling or redness  LABORATORY PANEL:   CBC  Recent Labs Lab 07/04/15 0653  WBC 14.0*  HGB 13.3  HCT 38.9*  PLT 338   ------------------------------------------------------------------------------------------------------------------ Chemistries   Recent Labs Lab 07/02/15 2108 07/03/15 0326  NA 132* 134*  K 3.6 3.7  CL 98* 101  CO2 25 27  GLUCOSE 99 102*  BUN 21* 20  CREATININE 1.24 1.18  CALCIUM 7.8* 7.6*  AST 29  --   ALT 32  --   ALKPHOS 138*  --   BILITOT 1.2  --    ------------------------------------------------------------------------------------------------------------------  Cardiac Enzymes No results for input(s): TROPONINI in the last 168 hours. ------------------------------------------------------------------------------------------------------------------  RADIOLOGY:  Mr Thoracic Spine W Wo Contrast  07/03/2015  CLINICAL DATA:  Knee back pain for 4-5 days. Retrocrural inflammation seen on CT scan abdomen and pelvis yesterday. Likely right basilar pneumonia. Initial encounter. EXAM: MRI THORACIC SPINE WITHOUT AND WITH CONTRAST TECHNIQUE: Multiplanar and multiecho pulse sequences of the thoracic spine were obtained without and with intravenous contrast. CONTRAST:  20 mL MULTIHANCE GADOBENATE DIMEGLUMINE 529 MG/ML IV SOLN COMPARISON:  CT abdomen and pelvis 06/28/2015 and  07/02/2015. FINDINGS: Vertebral body height  and alignment are maintained. Marrow edema and enhancement are seen throughout the T11 vertebral body and in the anterior, superior endplate of T12. Extensive paraspinous stranding is identified. A fluid collection anterior and to the right is centered about the T11 vertebral body measures approximately 3.0 cm transverse by 4.5 cm craniocaudal by 1.4 cm AP. The collection extends from the anterior aspect of the T10-11 disc interspace to the anterior margin of T11-12 and is consistent with abscess. There is mild irregularity of the T11-12 endplates. No epidural abscess is identified. The thoracic cord demonstrates normal signal throughout. The central canal and foramina appear open at all levels. Paraspinous structures demonstrate small bilateral pleural effusions, greater on the right, and right basilar airspace disease. 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 Osteomyelitis appears worst in T11 where there is abnormal signal and enhancement throughout the vertebral body. Rim enhancing fluid collection consistent with abscess anterior and to the left is centered about T11 and extends from the T10-11 level to the T12-L1 level. No epidural abscess is identified. Small bilateral pleural effusions, worse on the right and right basilar airspace disease worrisome for pneumonia. Critical Value/emergent results were called by telephone at the time of interpretation on 07/03/2015 at 10:44 am to Dr. Milagros Loll , who verbally acknowledged these results. Electronically Signed   By: Drusilla Kanner M.D.   On: 07/03/2015 10:45   Ct Abdomen Pelvis W Contrast  07/02/2015  CLINICAL DATA:  Generalized abdominal pain and distension for 7 days, constipation with nausea and vomiting, current diagnosis of shingles EXAM: CT ABDOMEN AND PELVIS WITH CONTRAST TECHNIQUE: Multidetector CT imaging of the abdomen and pelvis was performed using the standard  protocol following bolus administration of intravenous contrast. CONTRAST:  100 mL Isovue 370 COMPARISON:  06/28/2015 FINDINGS: Lower chest: Trace left pleural effusion. Small right pleural effusion. Both pleural effusions are larger when compared to the prior examination. Dependent consolidation also mildly worse bilaterally. Hepatobiliary: No significant abnormalities Pancreas: Fatty infiltration of the pancreas with no acute findings Spleen: Spleen is normal Adrenals/Urinary Tract: Adrenal glands are normal. Kidneys are normal. No hydronephrosis. Bladder is normal. Stomach/Bowel: Nonobstructive bowel gas pattern. There are fluid contents into the distal descending colon suggesting a diarrhea type illness. Stool is retained in the ascending colon. Appendix is normal. Small bowel and stomach are normal. Vascular/Lymphatic: No vascular abnormalities. No significant adenopathy. There are small retroperitoneal periaortic lymph nodes well within normal limits for size. Reproductive: Negative Other: There is no ascites. Musculoskeletal: From T10 through T11 and T12, there is increased soft tissue attenuation anterior to the vertebral bodies in the retrocrural region, also involving the right paraspinous retrocrural region. The adjacent vertebral bodies demonstrate degenerative disc disease with anterior osteophyte at T11-12, but no evidence of periosteal reaction or loss cortex to suggest osteomyelitis. IMPRESSION: Inflammation in the retrocrural space, worse compared to 06/28/15. Adjacent vertebral bodies appear normal, as does the aorta. Differential diagnosis possibilities include aortitis, diskitis, or seeding from infection in the lungs. There is increased bilateral pleural effusion and increased bilateral lower lobe infiltrate, right more than left, and pneumonia is not excluded. Consider CT thorax to fully image this process. These results will be called to the ordering clinician or representative by the Radiology  Department at the imaging location. Electronically Signed   By: Esperanza Heir M.D.   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 Sampson GoonFitzgerald of infectious disease. Discussed with Dr. Jannifer HickShikh of interventional radiology and patient has been scheduled for drainage of his prevertebral abscess tomorrow. Blood cultures no growth to date No recent procedures, dental work, artificial cardiac valves. Echocardiogram showed no endocarditis PICC line once blood cultures negative  *  Wolff-Parkinson-White syndrome is stable  * DVT prophylaxis with Lovenox  All the records are reviewed and case discussed with Care Management/Social Workerr. Management plans discussed with the patient, family and they are in agreement.  CODE STATUS: FULL CODE  DVT Prophylaxis: SCDs  TOTAL TIME TAKING CARE OF THIS PATIENT: 35 minutes.   POSSIBLE D/C IN 3-4 DAYS, DEPENDING ON CLINICAL CONDITION.  Discussed with patient and wife at bedside in detail and answered all questions  Orie FishermanSudini, Zandrea Kenealy R M.D on 07/04/2015 at 1:28 PM  Between 7am to 6pm - Pager - [phone]  After 6pm go to [email or web address] - password [email or web address] [facility]  [facility]  Office  [phone]  CC: Primary care physician; Tommie SamsJayce G Cook, DO  Note: This dictation was prepared with Dragon dictation along with smaller phrase technology. Any transcriptional errors that result from this process are unintentional.

## 2015-07-04 NOTE — Progress Notes (Signed)
[place] INFECTIOUS DISEASE PROGRESS NOTE Date of Admission:  07/02/2015     ID: Eugene Robinson is a 45 y.o. male with osteomyelitis T 11  Principal Problem:   CAP (community acquired pneumonia) Active Problems:   WPW (Wolff-Parkinson-White syndrome)   Back pain   GERD (gastroesophageal reflux disease)   Subjective: No fevers, hd stable. Still with back pain   ROS  Eleven systems are reviewed and negative except per hpi  Medications:  Antibiotics Given (last 72 hours)    Date/Time Action Medication Dose Rate   07/03/15 0513 Given   vancomycin (VANCOCIN) 1,500 mg in sodium chloride 0.9 % 500 mL IVPB 1,500 mg 250 mL/hr   07/03/15 3335 Given   piperacillin-tazobactam (ZOSYN) IVPB 4.5 g 4.5 g 25 mL/hr   07/03/15 0820 Given   valACYclovir (VALTREX) tablet 1,000 mg 1,000 mg    07/03/15 1801 Given   valACYclovir (VALTREX) tablet 1,000 mg 1,000 mg    07/03/15 2159 Given   valACYclovir (VALTREX) tablet 1,000 mg 1,000 mg    07/04/15 1024 Given   valACYclovir (VALTREX) tablet 1,000 mg 1,000 mg    07/04/15 1504 Given   valACYclovir (VALTREX) tablet 1,000 mg 1,000 mg      . [START ON 07/05/2015] enoxaparin (LOVENOX) injection  40 mg Subcutaneous Q24H  . metoprolol  50 mg Oral BID  . senna-docusate  2 tablet Oral BID  . sodium chloride flush  3 mL Intravenous Q12H  . valACYclovir  1,000 mg Oral TID    Objective: Vital signs in last 24 hours: Temp:  [98.5 F (36.9 C)-100.2 F (37.9 C)] 98.5 F (36.9 C) (03/23 1133) Pulse Rate:  [77-121] 79 (03/23 1133) Resp:  [14-18] 14 (03/23 1133) BP: (119-129)/(60-87) 129/62 mmHg (03/23 1133) SpO2:  [93 %-95 %] 94 % (03/23 1133) Constitutional: He is oriented to person, place, and time. Obese, in pain HENT: Perrla eomi, anicteric Mouth/Throat: Oropharynx is clear and dry. No oropharyngeal exudate.  Cardiovascular: Normal rate, regular rhythm and normal heart sounds.  Pulmonary/Chest: Effort normal and breath sounds normal. Dec BS  bases Abdominal: distended. Bowel sounds are normal. There is no tenderness.  Lymphadenopathy: He has no cervical adenopathy.  Neurological: He is alert and oriented to person, place, and time.  Skin: Skin is warm and dry. R back with one large blister (2 cm) and several healing spots No stigmata of endocarditis Psychiatric: He has a normal mood and affect. His behavior is normal.  EXT no cce  Lab Results  Recent Labs  07/02/15 2108 07/03/15 0326 07/04/15 0653  WBC 13.1* 12.0* 14.0*  HGB 13.3 12.7* 13.3  HCT 40.1 37.5* 38.9*  NA 132* 134*  --   K 3.6 3.7  --   CL 98* 101  --   CO2 25 27  --   BUN 21* 20  --   CREATININE 1.24 1.18  --     Microbiology: Results for orders placed or performed during the hospital encounter of 07/02/15  Blood culture (routine x 2)     Status: None (Preliminary result)   Collection Time: 07/02/15  9:08 PM  Result Value Ref Range Status   Specimen Description BLOOD RIGHT HAND  Final   Special Requests   Final    BOTTLES DRAWN AEROBIC AND ANAEROBIC 5MLANAEROBIC, 10MLAEROBIC   Culture NO GROWTH 2 DAYS  Final   Report Status PENDING  Incomplete  Blood culture (routine x 2)     Status: None (Preliminary result)   Collection Time:  07/02/15  9:08 PM  Result Value Ref Range Status   Specimen Description BLOOD RIGHT ANTECUBITAL  Final   Special Requests BOTTLES DRAWN AEROBIC AND ANAEROBIC 5ML  Final   Culture NO GROWTH 2 DAYS  Final   Report Status PENDING  Incomplete  Urine culture     Status: None   Collection Time: 07/03/15  2:19 AM  Result Value Ref Range Status   Specimen Description URINE, CLEAN CATCH  Final   Special Requests NONE  Final   Culture NO GROWTH 1 DAY  Final   Report Status 07/04/2015 FINAL  Final    Studies/Results: Mr Thoracic Spine W Wo Contrast  07/03/2015  CLINICAL DATA:  Knee back pain for 4-5 days. Retrocrural inflammation seen on CT scan abdomen and pelvis yesterday. Likely right basilar pneumonia. Initial  encounter. EXAM: MRI THORACIC SPINE WITHOUT AND WITH CONTRAST TECHNIQUE: Multiplanar and multiecho pulse sequences of the thoracic spine were obtained without and with intravenous contrast. CONTRAST:  20 mL MULTIHANCE GADOBENATE DIMEGLUMINE 529 MG/ML IV SOLN COMPARISON:  CT abdomen and pelvis 06/28/2015 and 07/02/2015. FINDINGS: Vertebral body height and alignment are maintained. Marrow edema and enhancement are seen throughout the T11 vertebral body and in the anterior, superior endplate of Z76. Extensive paraspinous stranding is identified. A fluid collection anterior and to the right is centered about the T11 vertebral body measures approximately 3.0 cm transverse by 4.5 cm craniocaudal by 1.4 cm AP. The collection extends from the anterior aspect of the T10-11 disc interspace to the anterior margin of T11-12 and is consistent with abscess. There is mild irregularity of the T11-12 endplates. No epidural abscess is identified. The thoracic cord demonstrates normal signal throughout. The central canal and foramina appear open at all levels. Paraspinous structures demonstrate small bilateral pleural effusions, greater on the right, and right basilar airspace disease. 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 Osteomyelitis appears worst in T11 where there is abnormal signal and enhancement throughout the vertebral body. Rim enhancing fluid collection consistent with abscess anterior and to the left is centered about T11 and extends from the T10-11 level to the T12-L1 level. No epidural abscess is identified. Small bilateral pleural effusions, worse on the right and right basilar airspace disease worrisome for pneumonia. Critical Value/emergent results were called by telephone at the time of interpretation on 07/03/2015 at 10:44 am to Dr. Hillary Bow , who verbally acknowledged these results. Electronically Signed   By: Inge Rise M.D.   On: 07/03/2015 10:45   Ct Abdomen  Pelvis W Contrast  07/02/2015  CLINICAL DATA:  Generalized abdominal pain and distension for 7 days, constipation with nausea and vomiting, current diagnosis of shingles EXAM: CT ABDOMEN AND PELVIS WITH CONTRAST TECHNIQUE: Multidetector CT imaging of the abdomen and pelvis was performed using the standard protocol following bolus administration of intravenous contrast. CONTRAST:  100 mL Isovue 370 COMPARISON:  06/28/2015 FINDINGS: Lower chest: Trace left pleural effusion. Small right pleural effusion. Both pleural effusions are larger when compared to the prior examination. Dependent consolidation also mildly worse bilaterally. Hepatobiliary: No significant abnormalities Pancreas: Fatty infiltration of the pancreas with no acute findings Spleen: Spleen is normal Adrenals/Urinary Tract: Adrenal glands are normal. Kidneys are normal. No hydronephrosis. Bladder is normal. Stomach/Bowel: Nonobstructive bowel gas pattern. There are fluid contents into the distal descending colon suggesting a diarrhea type illness. Stool is retained in the ascending colon. Appendix is normal. Small bowel and stomach are normal. Vascular/Lymphatic: No vascular abnormalities. No significant adenopathy.  There are small retroperitoneal periaortic lymph nodes well within normal limits for size. Reproductive: Negative Other: There is no ascites. Musculoskeletal: From T10 through T11 and T12, there is increased soft tissue attenuation anterior to the vertebral bodies in the retrocrural region, also involving the right paraspinous retrocrural region. The adjacent vertebral bodies demonstrate degenerative disc disease with anterior osteophyte at T11-12, but no evidence of periosteal reaction or loss cortex to suggest osteomyelitis. IMPRESSION: Inflammation in the retrocrural space, worse compared to 06/28/15. Adjacent vertebral bodies appear normal, as does the aorta. Differential diagnosis possibilities include aortitis, diskitis, or seeding from  infection in the lungs. There is increased bilateral pleural effusion and increased bilateral lower lobe infiltrate, right more than left, and pneumonia is not excluded. Consider CT thorax to fully image this process. These results will be called to the ordering clinician or representative by the Radiology Department at the imaging location. Electronically Signed   By: Skipper Cliche M.D.   On: 07/02/2015 17:54   Left ventricle: The cavity size was normal. Systolic function was  normal. The estimated ejection fraction was in the range of 60%  to 65%. Wall motion was normal; there were no regional wall  motion abnormalities. Left ventricular diastolic function  parameters were normal. - Mitral valve: There was mild regurgitation. - Left atrium: The atrium was mildly dilated. - Right ventricle: Systolic function was normal. - Pulmonary arteries: Systolic pressure was within the normal  range.  Impressions:  - No valve vegetation noted.   Assessment/Plan: Eugene Robinson is a 45 y.o. 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 He presents acutely over about 5-7 days with no obvious predisposing source of infection. His bcx so far are negative.HIV neg, ESR 68, TTE neg  I have reviewed MRI with radiology and discussed the case with patient and his wife. I would suggest holding further abx as long as he is stable and on Friday attempting aspiration of the phlegmon for routine cx and AFB. This will greatly assist in his treatment as he will need long term 6-8 weeks of IV abx.The patient and his wife agreed to this plan. We will place PICC once bcx neg x 48 hours  Continue to hold abx and aspirate in AM   Thank you very much for the consult. Will follow with you.  [place], [place]   07/04/2015, 3:07 PM

## 2015-07-04 NOTE — Care Management (Signed)
Discussed the likelihood of need for IV antibiotics at discharge.  Wife in agreement with this pan and states she does have some medical background and does not anticipate any problems with managmeing administration.  Provided list of home health agencies and infusion companies for review so can make selection of agencies.  Wife would like the plan that would cause the lest disruption to her family life and home as far as "a lot of people coming into the home.  spoke with ID and anticipate patient will discharge on 2 drugs with possible frequency of bid.  This is not certain.

## 2015-07-04 NOTE — Plan of Care (Signed)
Problem: Pain Management: Goal: Expressions of feelings of enhanced comfort will increase Outcome: Not Progressing Pain is not controlled. Pt is receiving morphine and percocet. Pt is not able to engage in any physical activity.

## 2015-07-04 NOTE — Progress Notes (Signed)
Patient resting in bed at this time. C/o of pain earlier today, given pain medications on several occasions. Currently c/o no pain. Patient has been educated on NPO after midnight d/t abscess drainage in AM. Will continue to assess. Patient is currently NSR. Trudee KusterBrandi R [place]

## 2015-07-05 ENCOUNTER — Inpatient Hospital Stay: Payer: BLUE CROSS/BLUE SHIELD

## 2015-07-05 ENCOUNTER — Encounter: Payer: Self-pay | Admitting: Radiology

## 2015-07-05 ENCOUNTER — Ambulatory Visit: Payer: BLUE CROSS/BLUE SHIELD | Admitting: Family Medicine

## 2015-07-05 LAB — PTT FACTOR INHIBITOR (MIXING STUDY)
APTT 11 MIX SALINE: 33 s
aPTT 1:1 Normal Plasma: 25.6 s (ref 22.9–30.2)
aPTT: 27.1 s (ref 22.9–30.2)

## 2015-07-05 MED ORDER — MIDAZOLAM HCL 5 MG/5ML IJ SOLN
INTRAMUSCULAR | Status: AC
  Filled 2015-07-05: qty 5

## 2015-07-05 MED ORDER — SODIUM CHLORIDE 0.9% FLUSH: 10.0000 mL | INTRAVENOUS | Status: DC | PRN

## 2015-07-05 MED ORDER — MIDAZOLAM HCL 2 MG/2ML IJ SOLN
INTRAMUSCULAR | Status: AC | PRN
  Administered 2015-07-05 (×2): 1 mg via INTRAVENOUS

## 2015-07-05 MED ORDER — FENTANYL CITRATE (PF) 100 MCG/2ML IJ SOLN
INTRAMUSCULAR | Status: AC | PRN
  Administered 2015-07-05 (×2): 50 ug via INTRAVENOUS

## 2015-07-05 MED ORDER — SODIUM CHLORIDE 0.9 % IV SOLN
1500.0000 mg | INTRAVENOUS | Status: AC
  Administered 2015-07-05: 1500 mg via INTRAVENOUS
  Filled 2015-07-05: qty 1500

## 2015-07-05 MED ORDER — SODIUM CHLORIDE 0.9 % IV SOLN: INTRAVENOUS | Status: DC

## 2015-07-05 MED ORDER — FENTANYL CITRATE (PF) 100 MCG/2ML IJ SOLN
INTRAMUSCULAR | Status: AC
  Filled 2015-07-05: qty 4

## 2015-07-05 MED ORDER — SODIUM CHLORIDE 0.9% FLUSH
10.0000 mL | Freq: Two times a day (BID) | INTRAVENOUS | Status: DC
  Administered 2015-07-06 – 2015-07-12 (×13): 10 mL

## 2015-07-05 MED ORDER — VANCOMYCIN HCL 10 G IV SOLR
1250.0000 mg | Freq: Three times a day (TID) | INTRAVENOUS | Status: DC
  Administered 2015-07-06 – 2015-07-07 (×5): 1250 mg via INTRAVENOUS
  Filled 2015-07-05 (×7): qty 1250

## 2015-07-05 MED ORDER — SODIUM CHLORIDE 0.9 % IV SOLN
INTRAVENOUS | Status: AC | PRN
  Administered 2015-07-05: 10 mL/h via INTRAVENOUS

## 2015-07-05 MED ORDER — CEFTRIAXONE SODIUM 2 G IJ SOLR
2.0000 g | Freq: Two times a day (BID) | INTRAMUSCULAR | Status: DC
  Administered 2015-07-05 – 2015-07-12 (×14): 2 g via INTRAVENOUS
  Filled 2015-07-05 (×18): qty 2

## 2015-07-05 NOTE — Progress Notes (Signed)
[facility] - [place] at [facility]   PATIENT NAME: Eugene Robinson    MR#:  [id number]  DATE OF BIRTH:  [birth year]  SUBJECTIVE:  CHIEF COMPLAINT:   Chief Complaint  Patient presents with  . Flank Pain   Admitted for back and flank pain. Started on vancomycin and Zosyn.  No weakness or tingling in lower extremity's. Constipation resolved  IV antibiotics stopped while waiting for prevertebral abscess drainage which Is scheduled for later today  REVIEW OF SYSTEMS:    Review of Systems  Constitutional: Positive for fever, chills and malaise/fatigue.  HENT: Negative for sore throat.   Eyes: Negative for blurred vision, double vision and pain.  Respiratory: Negative for cough, hemoptysis, shortness of breath and wheezing.   Cardiovascular: Negative for chest pain, palpitations, orthopnea and leg swelling.  Gastrointestinal: Positive for constipation. Negative for heartburn, nausea, vomiting, abdominal pain and diarrhea.  Genitourinary: Negative for dysuria and hematuria.  Musculoskeletal: Positive for back pain. Negative for joint pain.  Skin: Negative for rash.  Neurological: Positive for weakness. Negative for sensory change, speech change, focal weakness and headaches.  Endo/Heme/Allergies: Does not bruise/bleed easily.  Psychiatric/Behavioral: Negative for depression. The patient is not nervous/anxious.     DRUG ALLERGIES:  No Known Allergies  VITALS:  Blood pressure 118/68, pulse 84, temperature 98.4 F (36.9 C), temperature source Oral, resp. rate 19, height  (1.778 m), weight 115.849 kg (255 lb 6.4 oz), SpO2 93 %.  PHYSICAL EXAMINATION:   Physical Exam  GENERAL:  45 y.o.-year-old patient lying in the bed with  Distress due to back pain EYES: Pupils equal, round, reactive to light and accommodation. No scleral icterus. Extraocular muscles intact.  HEENT: Head atraumatic, normocephalic. Oropharynx and nasopharynx clear.  NECK:  Supple, no  jugular venous distention. No thyroid enlargement, no tenderness.  LUNGS: Normal breath sounds bilaterally, no wheezing, rales, rhonchi. No use of accessory muscles of respiration.  CARDIOVASCULAR: S1, S2 normal. No murmurs, rubs, or gallops.  ABDOMEN: Soft, nontender, nondistended. Bowel sounds present. No organomegaly or mass.  EXTREMITIES: No cyanosis, clubbing or edema b/l.    NEUROLOGIC: Cranial nerves II through XII are intact. No focal Motor or sensory deficits b/l.   PSYCHIATRIC: The patient is alert and oriented x 3.  SKIN: No obvious rash, lesion, or ulcer.   Lower thoracic point spinal tenderness. No swelling or redness  LABORATORY PANEL:   CBC  Recent Labs Lab 07/04/15 0653  WBC 14.0*  HGB 13.3  HCT 38.9*  PLT 338   ------------------------------------------------------------------------------------------------------------------ Chemistries   Recent Labs Lab 07/02/15 2108 07/03/15 0326  NA 132* 134*  K 3.6 3.7  CL 98* 101  CO2 25 27  GLUCOSE 99 102*  BUN 21* 20  CREATININE 1.24 1.18  CALCIUM 7.8* 7.6*  AST 29  --   ALT 32  --   ALKPHOS 138*  --   BILITOT 1.2  --    ------------------------------------------------------------------------------------------------------------------  Cardiac Enzymes No results for input(s): TROPONINI in the last 168 hours. ------------------------------------------------------------------------------------------------------------------  RADIOLOGY:  No results found.   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 Sampson Goon of infectious disease. Prevertebral abscess drainage later today Blood cultures no growth to date Echocardiogram showed no endocarditis PICC line to be placed today Febrile with MAXIMUM TEMPERATURE of 100 earlier today  *  Wolff-Parkinson-White syndrome is stable  * DVT prophylaxis with  Lovenox  All the records  are reviewed and case discussed with Care Management/Social Workerr. Management plans discussed with the patient, family and they are in agreement.  CODE STATUS: FULL CODE  DVT Prophylaxis: SCDs  TOTAL TIME TAKING CARE OF THIS PATIENT: 35 minutes.   POSSIBLE D/C IN 1-2 DAYS, DEPENDING ON CLINICAL CONDITION.  Possible discharge tomorrow if afebrile with infectious disease follow-up  Milagros LollSudini, Javonna Balli R M.D on 07/05/2015 at 1:50 PM  Between 7am to 6pm - Pager - [phone]  After 6pm go to [email or web address] - password [email or web address]  [facility]  Office  [phone]  CC: Primary care physician; Tommie SamsJayce G Cook, DO  Note: This dictation was prepared with Dragon dictation along with smaller phrase technology. Any transcriptional errors that result from this process are unintentional.

## 2015-07-05 NOTE — Care Management (Signed)
Patient having abscess drained today.  Spoke with patient's wife about agency preference for home health and  or infusion pharmacy and she stated that 'the doctor told me I did not need to worry with this right now- that the hospital will set it all up."  Explained that CM was the part of the hospital to set this up.  Agency preference for home health is [facility] and infusion pharmacy is [facility].  Heads up referrals to both.  Explained to jason from [facility] that have the script in the computer for Vancomycin and Ceftiaxone but do not know if these will be the meds ordered at discharge as waiting on culture report from the drainage of the abscess.  Explained to Sanfordheryl with [facility] that patient wishes to have as few visits as possible.  Checking with director regarding allowing wife to have some hands on experience with flushing line and initiating infusion.

## 2015-07-05 NOTE — Progress Notes (Signed)
Pt alert and oriented x4, complaints of pain 3/10 and back discomfort. Refuses pain meds at this time. Will continue to monitor. Bed in low position, call bell within reach.  Bed alarms on and functioning.  Assessment done and charted.  Will continue to monitor and do hourly rounding throughout the shift

## 2015-07-05 NOTE — Progress Notes (Addendum)
ANTIBIOTIC CONSULT NOTE - INITIAL  Pharmacy Consult for Vancomycin  Indication: vertebral osteomyelitis  No Known Allergies  Patient Measurements: Height:  (177.8 cm) Weight: 255 lb 6.4 oz (115.849 kg) IBW/kg (Calculated) : 73 Adjusted Body Weight: 90.12 kg   Vital Signs: Temp: 98.4 F (36.9 C) (03/24 1123) Temp Source: Oral (03/24 1123) BP: 120/71 mmHg (03/24 1516) Pulse Rate: 84 (03/24 1516) Intake/Output from previous day: 03/23 0701 - 03/24 0700 In: -  Out: 250 [Urine:250] Intake/Output from this shift:    Labs:  Recent Labs  07/02/15 2108 07/03/15 0326 07/04/15 0653  WBC 13.1* 12.0* 14.0*  HGB 13.3 12.7* 13.3  PLT 306 299 338  CREATININE 1.24 1.18  --    Estimated Creatinine Clearance: 99.7 mL/min (by C-G formula based on Cr of 1.18). No results for input(s): VANCOTROUGH, VANCOPEAK, VANCORANDOM, GENTTROUGH, GENTPEAK, GENTRANDOM, TOBRATROUGH, TOBRAPEAK, TOBRARND, AMIKACINPEAK, AMIKACINTROU, AMIKACIN in the last 72 hours.   Microbiology: Recent Results (from the past 720 hour(s))  Blood culture (routine x 2)     Status: None (Preliminary result)   Collection Time: 07/02/15  9:08 PM  Result Value Ref Range Status   Specimen Description BLOOD RIGHT HAND  Final   Special Requests   Final    BOTTLES DRAWN AEROBIC AND ANAEROBIC ,   Culture NO GROWTH 2 DAYS  Final   Report Status PENDING  Incomplete  Blood culture (routine x 2)     Status: None (Preliminary result)   Collection Time: 07/02/15  9:08 PM  Result Value Ref Range Status   Specimen Description BLOOD RIGHT ANTECUBITAL  Final   Special Requests BOTTLES DRAWN AEROBIC AND ANAEROBIC  Final   Culture NO GROWTH 2 DAYS  Final   Report Status PENDING  Incomplete  Urine culture     Status: None   Collection Time: 07/03/15  2:19 AM  Result Value Ref Range Status   Specimen Description URINE, CLEAN CATCH  Final   Special Requests NONE  Final   Culture NO GROWTH 1 DAY  Final    Report Status 07/04/2015 FINAL  Final    Medical History: Past Medical History  Diagnosis Date  . WPW (Wolff-Parkinson-White syndrome)   . Chicken pox   . GERD (gastroesophageal reflux disease)   . Kidney stones   . PVC's (premature ventricular contractions) 03/19/2015    Medications:  Prescriptions prior to admission  Medication Sig Dispense Refill Last Dose  . ciprofloxacin (CIPRO) 500 MG tablet Take 1 tablet by mouth 2 (two) times daily.   Past Month at Unknown time  . ibuprofen (ADVIL,MOTRIN) 200 MG tablet Take 200 mg by mouth every 6 (six) hours as needed.    PRN at PRN  . metoprolol (LOPRESSOR) 50 MG tablet Take 1 tablet (50 mg total) by mouth 2 (two) times daily. 60 tablet 11 07/01/2015 at Unknown time  . naproxen (NAPROSYN) 500 MG tablet Take 1 tablet by mouth 2 (two) times daily.   07/02/2015 at Unknown time  . ondansetron (ZOFRAN) 4 MG tablet Take 1 tablet (4 mg total) by mouth every 8 (eight) hours as needed for nausea or vomiting. 20 tablet 0 PRN at PRN  . oxyCODONE-acetaminophen (ROXICET) 5-325 MG tablet Take 1 tablet by mouth every 6 (six) hours as needed for severe pain. 12 tablet 0 PRN at PRN  . valACYclovir (VALTREX) 1000 MG tablet Take 1 tablet (1,000 mg total) by mouth 3 (three) times daily. 21 tablet 0 07/01/2015 at Unknown time   Assessment:  Pharmacy consulted to dose vancomycin in this 45 year old male with vertebral osteomyelitis.  CrCl = 99.7 ml/min  Ke = 0.08 hr-1 T1/2 = 8 hrs Vd = 81.1 L   Goal of Therapy:  Vancomycin trough level 15-20 mcg/ml  Plan:  Expected duration 7 days with resolution of temperature and/or normalization of WBC   Vancomycin 1500 mg IV X 1 given on 3/24 @ 19:00. Vancomycin 1250 mg IV Q8H ordered to start on 3/25 @ 1:00,  6 hrs after 1st dose (stacked dosing).  This pt is considerably overweight (59% over IBW) and may be at risk for accumulation.  Therefore will use lower dose for vanc scheduled dosing (1250 mg vs 1500 mg).  This  pt will reach Css by 3/26 @ 11:00.  Will draw 1st trough on 3/26 @ 8:30, which will be very close to Css.    Sharalyn Lomba D 07/05/2015,4:22 PM

## 2015-07-05 NOTE — Progress Notes (Signed)
  Subjective: 45 yo with T11 osteomyelitis and scheduled for CT guided aspiration of right paraspinal fluid collection.   Objective: Vital signs in last 24 hours: Temp:  [98.2 F (36.8 C)-100.3 F (37.9 C)] 98.4 F (36.9 C) (03/24 1123) Pulse Rate:  [76-98] 84 (03/24 1347) Resp:  [16-22] 19 (03/24 1347) BP: (116-138)/(60-71) 118/68 mmHg (03/24 1347) SpO2:  [92 %-96 %] 93 % (03/24 1347) Last BM Date: 07/04/15  Intake/Output from previous day: 03/23 0701 - 03/24 0700 In: -  Out: 250 [Urine:250] Intake/Output this shift:   Heart: RRR Lungs: Clear bilaterally Abdomen:  Soft and nontender  Lab Results:   Recent Labs  07/03/15 0326 07/04/15 0653  WBC 12.0* 14.0*  HGB 12.7* 13.3  HCT 37.5* 38.9*  PLT 299 338   BMET  Recent Labs  07/02/15 2108 07/03/15 0326  NA 132* 134*  K 3.6 3.7  CL 98* 101  CO2 25 27  GLUCOSE 99 102*  BUN 21* 20  CREATININE 1.24 1.18  CALCIUM 7.8* 7.6*   PT/INR  Recent Labs  07/04/15 1340  LABPROT 16.4*  INR 1.31   ABG No results for input(s): PHART, HCO3 in the last 72 hours.  Invalid input(s): PCO2, PO2  Studies/Results: No results found.  Anti-infectives: Anti-infectives    Start     Dose/Rate Route Frequency Ordered Stop   07/03/15 0600  piperacillin-tazobactam (ZOSYN) IVPB 4.5 g  Status:  Discontinued     4.5 g 25 mL/hr over 240 Minutes Intravenous 3 times per day 07/03/15 0259 07/03/15 1051   07/03/15 0400  vancomycin (VANCOCIN) 1,500 mg in sodium chloride 0.9 % 500 mL IVPB  Status:  Discontinued     1,500 mg 250 mL/hr over 120 Minutes Intravenous Every 12 hours 07/03/15 0258 07/03/15 1051   07/03/15 0149  valACYclovir (VALTREX) tablet 1,000 mg     1,000 mg Oral 3 times daily 07/03/15 0149     07/02/15 2115  vancomycin (VANCOCIN) IVPB 1000 mg/200 mL premix     1,000 mg 200 mL/hr over 60 Minutes Intravenous  Once 07/02/15 2100 07/02/15 2230   07/02/15 2115  piperacillin-tazobactam (ZOSYN) IVPB 3.375 g     3.375  g 100 mL/hr over 30 Minutes Intravenous  Once 07/02/15 2100 07/02/15 2141      Assessment/Plan: T11 osteomyelitis and scheduled for CT guided paraspinal aspiration.  H & P, labs and imaging have been reveiwed.  The procedure has been discussed with patient and informed consent obtained.  Plan for CT guided aspiration of paraspinal fluid.    Eugene Robinson 07/05/2015

## 2015-07-05 NOTE — Progress Notes (Signed)
Peripherally Inserted Central Catheter/Midline Placement  The IV Nurse has discussed with the patient and/or persons authorized to consent for the patient, the purpose of this procedure and the potential benefits and risks involved with this procedure.  The benefits include less needle sticks, lab draws from the catheter and patient may be discharged home with the catheter.  Risks include, but not limited to, infection, bleeding, blood clot (thrombus formation), and puncture of an artery; nerve damage and irregular heat beat.  Alternatives to this procedure were also discussed.  PICC/Midline Placement Documentation  PICC Single Lumen 07/05/15 PICC Left Basilic 42 cm 1 cm (Active)  Indication for Insertion or Continuance of Line Home intravenous therapies (PICC only) 07/05/2015  5:00 PM  Exposed Catheter (cm) 1 cm 07/05/2015  5:00 PM  Dressing Change Due 07/12/15 07/05/2015  5:00 PM       Stacie GlazeJoyce, Isra Lindy Horton 07/05/2015, 5:10 PM

## 2015-07-05 NOTE — Progress Notes (Signed)
Infectious Disease Long Term IV Antibiotic Orders  Diagnosis: T 11 osteomyelitis  Culture results neg  Allergies: No Known Allergies  Discharge antibiotics Vancomycin             1500     mg  every       12        hours .     Goal vancomycin trough 15-20.    Pharmacy to adjust dosing based on levels Ceftriaxone 2 grams every        12       hours  PICC Care per protocol Labs weekly while on IV antibiotics      CBC w diff   Comprehensive met panel Vancomycin Trough   CRP   Planned duration of antibiotics 6 weeks  Stop date  Aug 16 2015   Follow up clinic date TBD  FAX weekly labs to [phone]  Leonel Ramsay, MD

## 2015-07-05 NOTE — Progress Notes (Signed)
ID E note Cx remain neg I have ordered picc placement and placed an abx order sheet for vanco and ceftraixone which he can be discharged on pending culture results  I can see in 2 weeks and will adjust abx based on cx results

## 2015-07-05 NOTE — Procedures (Signed)
CT guided aspiration of right paraspinal abscess at T11.  2 ml of yellow purulent fluid was removed.  Minimal blood loss and no immediate complication.

## 2015-07-06 DIAGNOSIS — J189 Pneumonia, unspecified organism: Secondary | ICD-10-CM

## 2015-07-06 DIAGNOSIS — M4620 Osteomyelitis of vertebra, site unspecified: Secondary | ICD-10-CM

## 2015-07-06 DIAGNOSIS — J90 Pleural effusion, not elsewhere classified: Secondary | ICD-10-CM

## 2015-07-06 LAB — CREATININE, SERUM
CREATININE: 1.03 mg/dL (ref 0.61–1.24)
GFR calc Af Amer: >60 mL/min (ref >60)
GFR calc non Af Amer: >60 mL/min (ref >60)

## 2015-07-06 LAB — AMYLASE: AMYLASE: 12 U/L — AB (ref 28–100)

## 2015-07-06 NOTE — Progress Notes (Signed)
[facility] - [place] at [facility]   PATIENT NAME: Eugene Robinson    MR#:  [id number]  DATE OF BIRTH:  February 15, 1971  SUBJECTIVE:  CHIEF COMPLAINT:   Chief Complaint  Patient presents with  . Flank Pain   Admitted for back and flank pain. Started on vancomycin and Zosyn. No weakness or tingling in lower extremity's. S/p CT-guided paravertebral abscess drainage on 07/05/2015 PICC line placed on 07/05/2015.  Back pain improved Has chronic shortness of breath on exertion which is unchanged  REVIEW OF SYSTEMS:    Review of Systems  Constitutional: Positive for fever, chills and malaise/fatigue.  HENT: Negative for sore throat.   Eyes: Negative for blurred vision, double vision and pain.  Respiratory: Negative for cough, hemoptysis, shortness of breath and wheezing.   Cardiovascular: Negative for chest pain, palpitations, orthopnea and leg swelling.  Gastrointestinal: Positive for constipation. Negative for heartburn, nausea, vomiting, abdominal pain and diarrhea.  Genitourinary: Negative for dysuria and hematuria.  Musculoskeletal: Positive for back pain. Negative for joint pain.  Skin: Negative for rash.  Neurological: Positive for weakness. Negative for sensory change, speech change, focal weakness and headaches.  Endo/Heme/Allergies: Does not bruise/bleed easily.  Psychiatric/Behavioral: Negative for depression. The patient is not nervous/anxious.     DRUG ALLERGIES:  No Known Allergies  VITALS:  Blood pressure 124/63, pulse 91, temperature 99.3 F (37.4 C), temperature source Oral, resp. rate 20, height  (1.778 m), weight 115.849 kg (255 lb 6.4 oz), SpO2 94 %.  PHYSICAL EXAMINATION:   Physical Exam  GENERAL:  45 y.o.-year-old patient lying in the bed with  Distress due to back pain EYES: Pupils equal, round, reactive to light and accommodation. No scleral icterus. Extraocular muscles intact.  HEENT: Head atraumatic, normocephalic.  Oropharynx and nasopharynx clear.  NECK:  Supple, no jugular venous distention. No thyroid enlargement, no tenderness.  LUNGS: Normal breath sounds bilaterally, no wheezing, rales, rhonchi. No use of accessory muscles of respiration.  CARDIOVASCULAR: S1, S2 normal. No murmurs, rubs, or gallops.  ABDOMEN: Soft, nontender, nondistended. Bowel sounds present. No organomegaly or mass.  EXTREMITIES: No cyanosis, clubbing or edema b/l.    NEUROLOGIC: Cranial nerves II through XII are intact. No focal Motor or sensory deficits b/l.   PSYCHIATRIC: The patient is alert and oriented x 3.  SKIN: No obvious rash, lesion, or ulcer.   Lower thoracic point spinal tenderness. No swelling or redness  LABORATORY PANEL:   CBC  Recent Labs Lab 07/04/15 0653  WBC 14.0*  HGB 13.3  HCT 38.9*  PLT 338   ------------------------------------------------------------------------------------------------------------------ Chemistries   Recent Labs Lab 07/02/15 2108 07/03/15 0326 07/06/15 0634  NA 132* 134*  --   K 3.6 3.7  --   CL 98* 101  --   CO2 25 27  --   GLUCOSE 99 102*  --   BUN 21* 20  --   CREATININE 1.24 1.18 1.03  CALCIUM 7.8* 7.6*  --   AST 29  --   --   ALT 32  --   --   ALKPHOS 138*  --   --   BILITOT 1.2  --   --    ------------------------------------------------------------------------------------------------------------------  Cardiac Enzymes No results for input(s): TROPONINI in the last 168 hours. ------------------------------------------------------------------------------------------------------------------  RADIOLOGY:  Dg Chest Port 1 View  07/05/2015  CLINICAL DATA:  PICC line placement. EXAM: PORTABLE CHEST 1 VIEW COMPARISON:  None. FINDINGS: Cardiomegaly. Large RIGHT pleural effusion with basilar atelectasis. LEFT arm PICC line  has been inserted and lies with its tip at the cavoatrial junction in apparent satisfactory position. No pneumothorax. IMPRESSION: PICC line  tip at cavoatrial junction.  No adjustment needed. Large RIGHT pleural effusion. Electronically Signed   By: Elsie StainJohn T Curnes M.D.   On: 07/05/2015 17:47   Ct Image Guided Fluid Drain By Catheter  07/05/2015  INDICATION: Osteomyelitis at T11. Request for aspiration of the paraspinal fluid collection. EXAM: CT-GUIDED ASPIRATION OF PARASPINAL ABSCESS MEDICATIONS: The patient is currently admitted to the hospital and receiving intravenous antibiotics. The antibiotics were administered within an appropriate time frame prior to the initiation of the procedure. ANESTHESIA/SEDATION: Fentanyl 100 mcg IV; Versed 2.0 mg IV Moderate Sedation Time:  20 minutes The patient was continuously monitored during the procedure by the interventional radiology nurse under my direct supervision. COMPLICATIONS: None immediate. PROCEDURE: Informed written consent was obtained from the patient after a thorough discussion of the procedural risks, benefits and alternatives. All questions were addressed. A timeout was performed prior to the initiation of the procedure. Patient was placed prone on the CT scanner. Images through the lower chest were obtained. The right paraspinal fluid collection at T11 was identified. The right side of back was prepped with chlorhexidine and a sterile field was created. Skin and soft tissues were anesthetized with 1% lidocaine. 18 gauge trocar needle was directed into the right paraspinal space at T11. 2 mL of blood tinged yellow purulent fluid was removed. Needle was removed without complication. Bandage placed over the puncture site. FINDINGS: Low-density material along the right side of the lower thoracic vertebral bodies. The low-density fluid at the T11 was targeted. Needle was directed along the lateral aspect of the T11 vertebral body. 2 mL of purulent fluid was removed. Fluid was sent for abscess culture and AFP. IMPRESSION: Successful CT-guided aspiration of the T11 right paraspinal abscess collection.  Electronically Signed   By: Richarda OverlieAdam  Henn M.D.   On: 07/05/2015 15:06     ASSESSMENT AND PLAN:   * Large right-sided pleural effusion Incidental finding on chest x-ray done for PICC line placement Discussed Dr. Belia HemanKasa of pulmonary. Ordered an ultrasound thoracentesis.  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 Sample sent to the lab. On IV vancomycin and ceftriaxone. Discussed Dr. Sampson GoonFitzgerald of infectious disease. Blood cultures no growth to date Echocardiogram showed no endocarditis PICC line in place Continues to be febrile.  *  Wolff-Parkinson-White syndrome is stable  * DVT prophylaxis with Lovenox  All the records are reviewed and case discussed with Care Management/Social Workerr. Management plans discussed with the patient, family and they are in agreement.  CODE STATUS: FULL CODE  DVT Prophylaxis: SCDs  TOTAL TIME TAKING CARE OF THIS PATIENT: 35 minutes.   POSSIBLE D/C IN 1-2 DAYS, DEPENDING ON CLINICAL CONDITION.   Milagros LollSudini, Chloe Baig R M.D on 07/06/2015 at 10:54 AM  Between 7am to 6pm - Pager - [phone]  After 6pm go to [email or web address] - password [email or web address] [email or web address]  [facility]  Office  [phone]  CC: Primary care physician; Tommie SamsJayce G Cook, DO  Note: This dictation was prepared with Dragon dictation along with smaller phrase technology. Any transcriptional errors that result from this process are unintentional.

## 2015-07-06 NOTE — Consult Note (Signed)
CXR and CT chest reviewed  Will Order US guided Thoracentesis and assess fluid   Will follow up after [place]

## 2015-07-06 NOTE — Consult Note (Signed)
[facility] Consultation      Date: 07/06/2015,   MRN# [id number] Eugene Robinson December 17, 1970 Code Status:     Code Status Orders        Start     Ordered   07/03/15 0150  Full code   Continuous     07/03/15 0149    Code Status History    Date Active Date Inactive Code Status Order ID Comments User Context   This patient has a current code status but no historical code status.     Hosp day:@LENGTHOFSTAYDAYS @ Referring MD: @ATDPROV @     PCP:      AdmissionWeight: 255 lb (115.667 kg)                 CurrentWeight: 255 lb 6.4 oz (115.849 kg) Eugene Robinson is a 45 y.o. old male seen in consultation for effusion at the request of Dr. Elpidio Anis     CHIEF COMPLAINT:   Acute back pain   HISTORY OF PRESENT ILLNESS  45 y.o. male who presents with back pain. Patient states that this pain started 4-5 days ago.  -He then went to an urgent care as the pain persisted, and it was felt that he might have shingles. He then went to his primary care physician felt that there is something more going on, and sent him for CT abdomen,  - Repeat imaging shows what is likely basilar pneumonia and also an area of retrocrural inflammation.  -This may be due to seeding from what is possibly his pulmonary infection, or some other inflammatory process.   Patient has low garde fevers, was unable to walk. Patient had CT guided drainage of abscess  Patient admitted for pneumonia with acute Spinal abscess infection   PAST MEDICAL HISTORY   Past Medical History  Diagnosis Date  . WPW (Wolff-Parkinson-White syndrome)   . Chicken pox   . GERD (gastroesophageal reflux disease)   . Kidney stones   . PVC's (premature ventricular contractions) 03/19/2015     SURGICAL HISTORY   Past Surgical History  Procedure Laterality Date  . Adenoidectomy       FAMILY HISTORY   Family History  Problem Relation Age of Onset  . Cancer Mother     lung and breast  . Parkinson's disease  Mother      SOCIAL HISTORY   Social History  Substance Use Topics  . Smoking status: Never Smoker   . Smokeless tobacco: None  . Alcohol Use: No     MEDICATIONS    Home Medication:  No current outpatient prescriptions on file.  Current Medication:  Current facility-administered medications:  .  0.9 %  sodium chloride infusion, , Intravenous, Continuous, Berdine Dance, MD .  acetaminophen (TYLENOL) tablet 650 mg, 650 mg, Oral, Q6H PRN, 650 mg at 07/06/15 0518 **OR** acetaminophen (TYLENOL) suppository 650 mg, 650 mg, Rectal, Q6H PRN, Oralia Manis, MD .  cefTRIAXone (ROCEPHIN) 2 g in dextrose 5 % 50 mL IVPB, 2 g, Intravenous, Q12H, Milagros Loll, MD, 2 g at 07/06/15 4098 .  enoxaparin (LOVENOX) injection 40 mg, 40 mg, Subcutaneous, Q24H, Srikar Sudini, MD, 40 mg at 07/05/15 2117 .  metoprolol (LOPRESSOR) tablet 50 mg, 50 mg, Oral, BID, Oralia Manis, MD, 50 mg at 07/06/15 0919 .  morphine 4 MG/ML injection 4 mg, 4 mg, Intravenous, Q4H PRN, Oralia Manis, MD, 4 mg at 07/05/15 2141 .  ondansetron (ZOFRAN) tablet 4 mg, 4 mg, Oral, Q6H PRN **OR** ondansetron (ZOFRAN) injection 4  mg, 4 mg, Intravenous, Q6H PRN, Oralia Manis, MD .  oxyCODONE-acetaminophen (PERCOCET/ROXICET) 5-325 MG per tablet 1 tablet, 1 tablet, Oral, Q6H PRN, Oralia Manis, MD, 1 tablet at 07/06/15 1137 .  senna-docusate (Senokot-S) tablet 2 tablet, 2 tablet, Oral, BID, Milagros Loll, MD, 2 tablet at 07/06/15 0919 .  sodium chloride flush (NS) 0.9 % injection 10-40 mL, 10-40 mL, Intracatheter, Q12H, Srikar Sudini, MD, 10 mL at 07/06/15 0920 .  sodium chloride flush (NS) 0.9 % injection 10-40 mL, 10-40 mL, Intracatheter, PRN, Srikar Sudini, MD .  sodium chloride flush (NS) 0.9 % injection 3 mL, 3 mL, Intravenous, Q12H, Oralia Manis, MD, 3 mL at 07/06/15 4098 .  valACYclovir (VALTREX) tablet 1,000 mg, 1,000 mg, Oral, TID, Oralia Manis, MD, 1,000 mg at 07/06/15 0919 .  vancomycin (VANCOCIN) 1,250 mg in sodium chloride 0.9 %  250 mL IVPB, 1,250 mg, Intravenous, Q8H, Srikar Sudini, MD, 1,250 mg at 07/06/15 1191    ALLERGIES   Review of patient's allergies indicates no known allergies.     REVIEW OF SYSTEMS   Review of Systems  Constitutional: Positive for fever, chills and malaise/fatigue. Negative for weight loss and diaphoresis.  HENT: Negative for congestion.   Eyes: Negative for blurred vision and double vision.  Respiratory: Positive for shortness of breath. Negative for cough, hemoptysis, sputum production and wheezing.   Cardiovascular: Negative for chest pain, palpitations and orthopnea.  Gastrointestinal: Negative for heartburn, nausea, vomiting and abdominal pain.  Genitourinary: Negative for dysuria and urgency.  Musculoskeletal: Positive for back pain. Negative for myalgias and neck pain.  Skin: Negative for rash.  Neurological: Positive for weakness. Negative for dizziness, tingling, tremors and headaches.  Endo/Heme/Allergies: Does not bruise/bleed easily.  Psychiatric/Behavioral: Negative for depression, suicidal ideas and substance abuse.  All other systems reviewed and are negative.    VS: BP 144/67 mmHg  Pulse 95  Temp(Src) 100.1 F (37.8 C) (Oral)  Resp 21  Ht  (1.778 m)  Wt 255 lb 6.4 oz (115.849 kg)  BMI 36.65 kg/m2  SpO2 95%     PHYSICAL EXAM  Physical Exam  Constitutional: He is oriented to person, place, and time. He appears well-developed and well-nourished. No distress.  HENT:  Head: Normocephalic and atraumatic.  Mouth/Throat: No oropharyngeal exudate.  Eyes: EOM are normal. Pupils are equal, round, and reactive to light. No scleral icterus.  Neck: Normal range of motion. Neck supple.  Cardiovascular: Normal rate, regular rhythm and normal heart sounds.   No murmur heard. Pulmonary/Chest: No stridor. No respiratory distress. He has no wheezes.  Slight SOB  Abdominal: Soft. Bowel sounds are normal. He exhibits no distension.  Musculoskeletal: Normal  range of motion. He exhibits no edema.  Neurological: He is alert and oriented to person, place, and time. He displays normal reflexes. Coordination normal.  Skin: Skin is warm. He is not diaphoretic.  Psychiatric: He has a normal mood and affect.        LABS    Recent Labs     07/04/15  0653  07/04/15  1340  07/06/15  0634  HGB  13.3   --    --   HCT  38.9*   --    --   MCV  85.0   --    --   WBC  14.0*   --    --   CREATININE   --    --   1.03  INR   --   1.31   --   ,  CULTURE RESULTS   Recent Results (from the past 240 hour(s))  Blood culture (routine x 2)     Status: None (Preliminary result)   Collection Time: 07/02/15  9:08 PM  Result Value Ref Range Status   Specimen Description BLOOD RIGHT HAND  Final   Special Requests   Final    BOTTLES DRAWN AEROBIC AND ANAEROBIC 5MLANAEROBIC, 10MLAEROBIC   Culture NO GROWTH 4 DAYS  Final   Report Status PENDING  Incomplete  Blood culture (routine x 2)     Status: None (Preliminary result)   Collection Time: 07/02/15  9:08 PM  Result Value Ref Range Status   Specimen Description BLOOD RIGHT ANTECUBITAL  Final   Special Requests BOTTLES DRAWN AEROBIC AND ANAEROBIC 5ML  Final   Culture NO GROWTH 4 DAYS  Final   Report Status PENDING  Incomplete  Urine culture     Status: None   Collection Time: 07/03/15  2:19 AM  Result Value Ref Range Status   Specimen Description URINE, CLEAN CATCH  Final   Special Requests NONE  Final   Culture NO GROWTH 1 DAY  Final   Report Status 07/04/2015 FINAL  Final  Culture, routine-abscess     Status: None (Preliminary result)   Collection Time: 07/05/15  2:25 PM  Result Value Ref Range Status   Specimen Description ABSCESS  Final   Special Requests NONE  Final   Gram Stain   Final    MANY WBC SEEN MANY RED BLOOD CELLS NO ORGANISMS SEEN    Culture NO GROWTH < 24 HOURS  Final   Report Status PENDING  Incomplete          IMAGING    Mr Thoracic Spine W Wo  Contrast  07/03/2015  CLINICAL DATA:  Knee back pain for 4-5 days. Retrocrural inflammation seen on CT scan abdomen and pelvis yesterday. Likely right basilar pneumonia. Initial encounter. EXAM: MRI THORACIC SPINE WITHOUT AND WITH CONTRAST TECHNIQUE: Multiplanar and multiecho pulse sequences of the thoracic spine were obtained without and with intravenous contrast. CONTRAST:  20 mL MULTIHANCE GADOBENATE DIMEGLUMINE 529 MG/ML IV SOLN COMPARISON:  CT abdomen and pelvis 06/28/2015 and 07/02/2015. FINDINGS: Vertebral body height and alignment are maintained. Marrow edema and enhancement are seen throughout the T11 vertebral body and in the anterior, superior endplate of T12. Extensive paraspinous stranding is identified. A fluid collection anterior and to the right is centered about the T11 vertebral body measures approximately 3.0 cm transverse by 4.5 cm craniocaudal by 1.4 cm AP. The collection extends from the anterior aspect of the T10-11 disc interspace to the anterior margin of T11-12 and is consistent with abscess. There is mild irregularity of the T11-12 endplates. No epidural abscess is identified. The thoracic cord demonstrates normal signal throughout. The central canal and foramina appear open at all levels. Paraspinous structures demonstrate small bilateral pleural effusions, greater on the right, and right basilar airspace disease. 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 Osteomyelitis appears worst in T11 where there is abnormal signal and enhancement throughout the vertebral body. Rim enhancing fluid collection consistent with abscess anterior and to the left is centered about T11 and extends from the T10-11 level to the T12-L1 level. No epidural abscess is identified. Small bilateral pleural effusions, worse on the right and right basilar airspace disease worrisome for pneumonia. Critical Value/emergent results were called by telephone at the time of  interpretation on 07/03/2015 at 10:44 am to Dr. Milagros LollSRIKAR SUDINI , who verbally acknowledged  these results. Electronically Signed   By: Drusilla Kanner M.D.   On: 07/03/2015 10:45   Ct Abdomen Pelvis W Contrast  07/02/2015  CLINICAL DATA:  Generalized abdominal pain and distension for 7 days, constipation with nausea and vomiting, current diagnosis of shingles EXAM: CT ABDOMEN AND PELVIS WITH CONTRAST TECHNIQUE: Multidetector CT imaging of the abdomen and pelvis was performed using the standard protocol following bolus administration of intravenous contrast. CONTRAST:  100 mL Isovue 370 COMPARISON:  06/28/2015 FINDINGS: Lower chest: Trace left pleural effusion. Small right pleural effusion. Both pleural effusions are larger when compared to the prior examination. Dependent consolidation also mildly worse bilaterally. Hepatobiliary: No significant abnormalities Pancreas: Fatty infiltration of the pancreas with no acute findings Spleen: Spleen is normal Adrenals/Urinary Tract: Adrenal glands are normal. Kidneys are normal. No hydronephrosis. Bladder is normal. Stomach/Bowel: Nonobstructive bowel gas pattern. There are fluid contents into the distal descending colon suggesting a diarrhea type illness. Stool is retained in the ascending colon. Appendix is normal. Small bowel and stomach are normal. Vascular/Lymphatic: No vascular abnormalities. No significant adenopathy. There are small retroperitoneal periaortic lymph nodes well within normal limits for size. Reproductive: Negative Other: There is no ascites. Musculoskeletal: From T10 through T11 and T12, there is increased soft tissue attenuation anterior to the vertebral bodies in the retrocrural region, also involving the right paraspinous retrocrural region. The adjacent vertebral bodies demonstrate degenerative disc disease with anterior osteophyte at T11-12, but no evidence of periosteal reaction or loss cortex to suggest osteomyelitis. IMPRESSION: Inflammation  in the retrocrural space, worse compared to 06/28/15. Adjacent vertebral bodies appear normal, as does the aorta. Differential diagnosis possibilities include aortitis, diskitis, or seeding from infection in the lungs. There is increased bilateral pleural effusion and increased bilateral lower lobe infiltrate, right more than left, and pneumonia is not excluded. Consider CT thorax to fully image this process. These results will be called to the ordering clinician or representative by the Radiology Department at the imaging location. Electronically Signed   By: Esperanza Heir M.D.   On: 07/02/2015 17:54   Ct Abdomen Pelvis W Contrast  06/28/2015  CLINICAL DATA:  Right flank pain radiating to the right lower quadrant of the abdomen for the past 3 days. EXAM: CT ABDOMEN AND PELVIS WITH CONTRAST TECHNIQUE: Multidetector CT imaging of the abdomen and pelvis was performed using the standard protocol following bolus administration of intravenous contrast. CONTRAST:  OMNIPAQUE IOHEXOL 300 MG/ML  SOLN COMPARISON:  None. FINDINGS: Lower chest: Mild dependent atelectasis at the right lung base and minimal dependent atelectasis at the left lung base. Hepatobiliary: Minimal diffuse low density of the liver relative to the spleen. Normal appearing gallbladder. Pancreas: Mild diffuse atrophy with fatty replacement. Spleen: Within normal limits in size and appearance. Adrenals/Urinary Tract: No masses identified. No evidence of hydronephrosis. Stomach/Bowel: Moderate-sized sigmoid colon diverticulum. Normal appearing appendix. No gastric or small bowel abnormalities. Vascular/Lymphatic: No pathologically enlarged lymph nodes. No evidence of abdominal aortic aneurysm. Reproductive: No mass or other significant abnormality. Other: Small umbilical hernia containing fat. Small bilateral inguinal hernias containing fat, larger on the left. Musculoskeletal: Lumbar and lower thoracic spine degenerative changes. IMPRESSION: 1. No  acute abdominal or pelvic abnormality. 2. Mild right basilar atelectasis and minimal left basilar atelectasis. 3. Minimal diffuse hepatic steatosis. 4. Moderate-sized sigmoid colon diverticulum. 5. Small umbilical and bilateral inguinal hernias containing fat. Electronically Signed   By: Beckie Salts M.D.   On: 06/28/2015 20:27   Dg Chest [facility] 1 [address]  07/05/2015  CLINICAL DATA:  PICC line placement. EXAM: PORTABLE CHEST 1 VIEW COMPARISON:  None. FINDINGS: Cardiomegaly. Large RIGHT pleural effusion with basilar atelectasis. LEFT arm PICC line has been inserted and lies with its tip at the cavoatrial junction in apparent satisfactory position. No pneumothorax. IMPRESSION: PICC line tip at cavoatrial junction.  No adjustment needed. Large RIGHT pleural effusion. Electronically Signed   By: Elsie Stain M.D.   On: 07/05/2015 17:47   Ct Image Guided Fluid Drain By Catheter  07/05/2015  INDICATION: Osteomyelitis at T11. Request for aspiration of the paraspinal fluid collection. EXAM: CT-GUIDED ASPIRATION OF PARASPINAL ABSCESS MEDICATIONS: The patient is currently admitted to the hospital and receiving intravenous antibiotics. The antibiotics were administered within an appropriate time frame prior to the initiation of the procedure. ANESTHESIA/SEDATION: Fentanyl 100 mcg IV; Versed 2.0 mg IV Moderate Sedation Time:  20 minutes The patient was continuously monitored during the procedure by the interventional radiology nurse under my direct supervision. COMPLICATIONS: None immediate. PROCEDURE: Informed written consent was obtained from the patient after a thorough discussion of the procedural risks, benefits and alternatives. All questions were addressed. A timeout was performed prior to the initiation of the procedure. Patient was placed prone on the CT scanner. Images through the lower chest were obtained. The right paraspinal fluid collection at T11 was identified. The right side of back was prepped with  chlorhexidine and a sterile field was created. Skin and soft tissues were anesthetized with 1% lidocaine. 18 gauge trocar needle was directed into the right paraspinal space at T11. 2 mL of blood tinged yellow purulent fluid was removed. Needle was removed without complication. Bandage placed over the puncture site. FINDINGS: Low-density material along the right side of the lower thoracic vertebral bodies. The low-density fluid at the T11 was targeted. Needle was directed along the lateral aspect of the T11 vertebral body. 2 mL of purulent fluid was removed. Fluid was sent for abscess culture and AFP. IMPRESSION: Successful CT-guided aspiration of the T11 right paraspinal abscess collection. Electronically Signed   By: Richarda Overlie M.D.   On: 07/05/2015 15:06        ASSESSMENT/PLAN   45 yo white male with acute pneumonia with acute spinal abscess with pleural effusions  1.will obtain US guided thoracentesis-this is not urgent at this time and can wait till IR available-patient is already on IV abx  2.continue iV abx as prescribed, follow ID recs   I have personally obtained a history, examined the patient, evaluated laboratory and independently reviewed imaging results, formulated the assessment and plan and placed orders.  The Patient requires high complexity decision making for assessment and support, frequent evaluation and titration of therapies, application of advanced monitoring technologies and extensive interpretation of multiple databases.    Patient satisfied with Plan of action and management. All questions answered  Lucie Leather, M.D.  [facility]  Medical Director [facility] Medical Director [facility] Cardio-Pulmonary Department

## 2015-07-06 NOTE — Progress Notes (Signed)
Called and spoke to Dr Belia HemanKasa on the phone after a call from radiology.  Informed him that he would have to call and talk to the radiologist to get thoracentesis done today. He acknowledged this. And said whenever they get to it is fine. I offered him the number to the rad on call and he said he did not need it.

## 2015-07-07 ENCOUNTER — Inpatient Hospital Stay: Payer: BLUE CROSS/BLUE SHIELD

## 2015-07-07 LAB — BASIC METABOLIC PANEL
ANION GAP: 6 (ref 5–15)
BUN: 9 mg/dL (ref 6–20)
CO2: 26 mmol/L (ref 22–32)
Calcium: 7.6 mg/dL — ABNORMAL LOW (ref 8.9–10.3)
Chloride: 100 mmol/L — ABNORMAL LOW (ref 101–111)
Creatinine, Ser: 0.95 mg/dL (ref 0.61–1.24)
GFR calc Af Amer: >60 mL/min (ref >60)
Glucose, Bld: 108 mg/dL — ABNORMAL HIGH (ref 65–99)
POTASSIUM: 2.9 mmol/L — AB (ref 3.5–5.1)
SODIUM: 132 mmol/L — AB (ref 135–145)

## 2015-07-07 LAB — MAGNESIUM: Magnesium: 1.8 mg/dL (ref 1.7–2.4)

## 2015-07-07 LAB — CBC WITH DIFFERENTIAL/PLATELET
BASOS ABS: 0.1 10*3/uL (ref 0–0.1)
BASOS PCT: 1 %
Eosinophils Absolute: 0.4 10*3/uL (ref 0–0.7)
Eosinophils Relative: 3 %
HCT: 37 % — ABNORMAL LOW (ref 40.0–52.0)
Hemoglobin: 12.6 g/dL — ABNORMAL LOW (ref 13.0–18.0)
LYMPHS ABS: 1 10*3/uL (ref 1.0–3.6)
LYMPHS PCT: 7 %
MCH: 28.5 pg (ref 26.0–34.0)
MCHC: 34 g/dL (ref 32.0–36.0)
MCV: 83.8 fL (ref 80.0–100.0)
MONOS PCT: 9 %
Monocytes Absolute: 1.3 10*3/uL — ABNORMAL HIGH (ref 0.2–1.0)
NEUTROS PCT: 80 %
Neutro Abs: 11.7 10*3/uL — ABNORMAL HIGH (ref 1.4–6.5)
PLATELETS: 354 10*3/uL (ref 150–440)
RBC: 4.41 MIL/uL (ref 4.40–5.90)
RDW: 13.7 % (ref 11.5–14.5)
WBC: 14.4 10*3/uL — AB (ref 3.8–10.6)

## 2015-07-07 LAB — VANCOMYCIN, TROUGH: VANCOMYCIN TR: 12 ug/mL (ref 10–20)

## 2015-07-07 MED ORDER — VANCOMYCIN HCL 10 G IV SOLR
1500.0000 mg | Freq: Three times a day (TID) | INTRAVENOUS | Status: DC
  Administered 2015-07-07 – 2015-07-11 (×12): 1500 mg via INTRAVENOUS
  Filled 2015-07-07 (×17): qty 1500

## 2015-07-07 MED ORDER — POTASSIUM CHLORIDE CRYS ER 20 MEQ PO TBCR
40.0000 meq | EXTENDED_RELEASE_TABLET | ORAL | Status: AC
  Administered 2015-07-07 (×2): 40 meq via ORAL
  Filled 2015-07-07: qty 2

## 2015-07-07 NOTE — Progress Notes (Signed)
[facility] [place] [facility]   PATIENT NAME: Eugene Robinson    MR#:  [id number]  DATE OF BIRTH:  1970-07-26  SUBJECTIVE:  CHIEF COMPLAINT:   Chief Complaint  Patient presents with  . Flank Pain   Admitted for back and flank pain. Started on vancomycin and Zosyn. No weakness or tingling in lower extremity's. S/p CT-guided paravertebral abscess drainage on 07/05/2015 PICC line placed on 07/05/2015.  Back pain improved. Has mild chronic SOB. Worse since yesterday. Has chest tightness on the right side. REVIEW OF SYSTEMS:    Review of Systems  Constitutional: Positive for fever, chills and malaise/fatigue.  HENT: Negative for sore throat.   Eyes: Negative for blurred vision, double vision and pain.  Respiratory: Negative for cough, hemoptysis, shortness of breath and wheezing.   Cardiovascular: Negative for chest pain, palpitations, orthopnea and leg swelling.  Gastrointestinal: Positive for constipation. Negative for heartburn, nausea, vomiting, abdominal pain and diarrhea.  Genitourinary: Negative for dysuria and hematuria.  Musculoskeletal: Positive for back pain. Negative for joint pain.  Skin: Negative for rash.  Neurological: Positive for weakness. Negative for sensory change, speech change, focal weakness and headaches.  Endo/Heme/Allergies: Does not bruise/bleed easily.  Psychiatric/Behavioral: Negative for depression. The patient is not nervous/anxious.     DRUG ALLERGIES:  No Known Allergies  VITALS:  Blood pressure 142/72, pulse 92, temperature 99.6 F (37.6 C), temperature source Oral, resp. rate 20, height  (1.778 m), weight 115.849 kg (255 lb 6.4 oz), SpO2 94 %.  PHYSICAL EXAMINATION:   Physical Exam  GENERAL:  45 y.o.-year-old patient lying in the bed with  Distress due to back pain EYES: Pupils equal, round, reactive to light and accommodation. No scleral icterus. Extraocular muscles intact.  HEENT: Head atraumatic,  normocephalic. Oropharynx and nasopharynx clear.  NECK:  Supple, no jugular venous distention. No thyroid enlargement, no tenderness.  LUNGS: Increased work of breathing. Decreased breath sounds right side CARDIOVASCULAR: S1, S2 normal. No murmurs, rubs, or gallops.  ABDOMEN: Soft, nontender, nondistended. Bowel sounds present. No organomegaly or mass.  EXTREMITIES: No cyanosis, clubbing or edema b/l.    NEUROLOGIC: Cranial nerves II through XII are intact. No focal Motor or sensory deficits b/l.   PSYCHIATRIC: The patient is alert and oriented x 3.  SKIN: No obvious rash, lesion, or ulcer.   LABORATORY PANEL:   CBC  Recent Labs Lab 07/07/15 0522  WBC 14.4*  HGB 12.6*  HCT 37.0*  PLT 354   ------------------------------------------------------------------------------------------------------------------ Chemistries   Recent Labs Lab 07/02/15 2108  07/07/15 0522  NA 132*  < > 132*  K 3.6  < > 2.9*  CL 98*  < > 100*  CO2 25  < > 26  GLUCOSE 99  < > 108*  BUN 21*  < > 9  CREATININE 1.24  < > 0.95  CALCIUM 7.8*  < > 7.6*  AST 29  --   --   ALT 32  --   --   ALKPHOS 138*  --   --   BILITOT 1.2  --   --   < > = values in this interval not displayed. ------------------------------------------------------------------------------------------------------------------  Cardiac Enzymes No results for input(s): TROPONINI in the last 168 hours. ------------------------------------------------------------------------------------------------------------------  RADIOLOGY:  Dg Chest Port 1 View  07/05/2015  CLINICAL DATA:  PICC line placement. EXAM: PORTABLE CHEST 1 VIEW COMPARISON:  None. FINDINGS: Cardiomegaly. Large RIGHT pleural effusion with basilar atelectasis. LEFT arm PICC line has been inserted and lies  with its tip at the cavoatrial junction in apparent satisfactory position. No pneumothorax. IMPRESSION: PICC line tip at cavoatrial junction.  No adjustment needed. Large RIGHT  pleural effusion. Electronically Signed   By: Elsie StainJohn T Curnes M.D.   On: 07/05/2015 17:47   Ct Image Guided Fluid Drain By Catheter  07/05/2015  INDICATION: Osteomyelitis at T11. Request for aspiration of the paraspinal fluid collection. EXAM: CT-GUIDED ASPIRATION OF PARASPINAL ABSCESS MEDICATIONS: The patient is currently admitted to the hospital and receiving intravenous antibiotics. The antibiotics were administered within an appropriate time frame prior to the initiation of the procedure. ANESTHESIA/SEDATION: Fentanyl 100 mcg IV; Versed 2.0 mg IV Moderate Sedation Time:  20 minutes The patient was continuously monitored during the procedure by the interventional radiology nurse under my direct supervision. COMPLICATIONS: None immediate. PROCEDURE: Informed written consent was obtained from the patient after a thorough discussion of the procedural risks, benefits and alternatives. All questions were addressed. A timeout was performed prior to the initiation of the procedure. Patient was placed prone on the CT scanner. Images through the lower chest were obtained. The right paraspinal fluid collection at T11 was identified. The right side of back was prepped with chlorhexidine and a sterile field was created. Skin and soft tissues were anesthetized with 1% lidocaine. 18 gauge trocar needle was directed into the right paraspinal space at T11. 2 mL of blood tinged yellow purulent fluid was removed. Needle was removed without complication. Bandage placed over the puncture site. FINDINGS: Low-density material along the right side of the lower thoracic vertebral bodies. The low-density fluid at the T11 was targeted. Needle was directed along the lateral aspect of the T11 vertebral body. 2 mL of purulent fluid was removed. Fluid was sent for abscess culture and AFP. IMPRESSION: Successful CT-guided aspiration of the T11 right paraspinal abscess collection. Electronically Signed   By: Richarda OverlieAdam  Henn M.D.   On: 07/05/2015  15:06     ASSESSMENT AND PLAN:   * Large right-sided pleural effusion Incidental finding on chest x-ray done for PICC line placement Discussed Dr. Belia HemanKasa of pulmonary. Ordered an ultrasound thoracentesis. Discussed Dr. Grace IsaacWatts of interventional radiology. Advised on getting a CT scan of the chest to see if this is infiltrate versus fluid. Ordered.  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 Sample sent to the lab. On IV vancomycin and ceftriaxone. Discussed Dr. Sampson GoonFitzgerald of infectious disease. Blood cultures no growth to date. PICC line in place Echocardiogram showed no endocarditis Continues to be febrile.  *  Wolff-Parkinson-White syndrome is stable  * DVT prophylaxis with Lovenox  All the records are reviewed and case discussed with Care Management/Social Workerr. Management plans discussed with the patient, family and they are in agreement.  CODE STATUS: FULL CODE  DVT Prophylaxis: SCDs  TOTAL TIME TAKING CARE OF THIS PATIENT: 35 minutes.   POSSIBLE D/C IN 1-2 DAYS, DEPENDING ON CLINICAL CONDITION.   Milagros LollSudini, Samanta Gal R M.D on 07/07/2015 at 10:13 AM  Between 7am to 6pm - Pager - [phone]  After 6pm go to [email or web address] - password [email or web address] [facility]  [facility]  Office  [phone]  CC: Primary care physician; Tommie SamsJayce G Cook, DO  Note: This dictation was prepared with Dragon dictation along with smaller phrase technology. Any transcriptional errors that result from this process are unintentional.

## 2015-07-07 NOTE — Progress Notes (Signed)
ANTIBIOTIC CONSULT NOTE - INITIAL  Pharmacy Consult for Vancomycin  Indication: vertebral osteomyelitis  No Known Allergies  Patient Measurements: Height: 5\' 10"  (177.8 cm) Weight: 255 lb 6.4 oz (115.849 kg) IBW/kg (Calculated) : 73 Adjusted Body Weight: 90.12 kg   Vital Signs: Temp: 99.6 F (37.6 C) (03/26 0900) Temp Source: Oral (03/26 0900) BP: 142/72 mmHg (03/26 0900) Pulse Rate: 92 (03/26 0900) Intake/Output from previous day: 03/25 0701 - 03/26 0700 In: 250 [P.O.:240; I.V.:10] Out: 1200 [Urine:1200] Intake/Output from this shift:    Labs:  Recent Labs  07/06/15 0634 07/07/15 0522  WBC  --  14.4*  HGB  --  12.6*  PLT  --  354  CREATININE 1.03 0.95   Estimated Creatinine Clearance: 123.8 mL/min (by C-G formula based on Cr of 0.95).  Recent Labs  07/07/15 0818  [facility]     Microbiology: Recent Results (from the past 720 hour(s))  Blood culture (routine x 2)     Status: None (Preliminary result)   Collection Time: 07/02/15  9:08 PM  Result Value Ref Range Status   Specimen Description BLOOD RIGHT HAND  Final   Special Requests   Final    BOTTLES DRAWN AEROBIC AND ANAEROBIC 5MLANAEROBIC, 10MLAEROBIC   Culture NO GROWTH 4 DAYS  Final   Report Status PENDING  Incomplete  Blood culture (routine x 2)     Status: None (Preliminary result)   Collection Time: 07/02/15  9:08 PM  Result Value Ref Range Status   Specimen Description BLOOD RIGHT ANTECUBITAL  Final   Special Requests BOTTLES DRAWN AEROBIC AND ANAEROBIC 5ML  Final   Culture NO GROWTH 4 DAYS  Final   Report Status PENDING  Incomplete  Urine culture     Status: None   Collection Time: 07/03/15  2:19 AM  Result Value Ref Range Status   Specimen Description URINE, CLEAN CATCH  Final   Special Requests NONE  Final   Culture NO GROWTH 1 DAY  Final   Report Status 07/04/2015 FINAL  Final  Culture, routine-abscess     Status: None (Preliminary result)   Collection Time: 07/05/15  2:25 PM   Result Value Ref Range Status   Specimen Description ABSCESS  Final   Special Requests NONE  Final   Gram Stain   Final    MANY WBC SEEN MANY RED BLOOD CELLS NO ORGANISMS SEEN    Culture NO GROWTH 2 DAYS  Final   Report Status PENDING  Incomplete    Medical History: Past Medical History  Diagnosis Date  . WPW (Wolff-Parkinson-White syndrome)   . Chicken pox   . GERD (gastroesophageal reflux disease)   . Kidney stones   . PVC's (premature ventricular contractions) 03/19/2015    Medications:  Prescriptions prior to admission  Medication Sig Dispense Refill Last Dose  . ciprofloxacin (CIPRO) 500 MG tablet Take 1 tablet by mouth 2 (two) times daily.   Past Month at Unknown time  . ibuprofen (ADVIL,MOTRIN) 200 MG tablet Take 200 mg by mouth every 6 (six) hours as needed.    PRN at PRN  . metoprolol (LOPRESSOR) 50 MG tablet Take 1 tablet (50 mg total) by mouth 2 (two) times daily. 60 tablet 11 07/01/2015 at Unknown time  . naproxen (NAPROSYN) 500 MG tablet Take 1 tablet by mouth 2 (two) times daily.   07/02/2015 at Unknown time  . ondansetron (ZOFRAN) 4 MG tablet Take 1 tablet (4 mg total) by mouth every 8 (eight) hours as needed for  nausea or vomiting. 20 tablet 0 PRN at PRN  . oxyCODONE-acetaminophen (ROXICET) 5-325 MG tablet Take 1 tablet by mouth every 6 (six) hours as needed for severe pain. 12 tablet 0 PRN at PRN  . valACYclovir (VALTREX) 1000 MG tablet Take 1 tablet (1,000 mg total) by mouth 3 (three) times daily. 21 tablet 0 07/01/2015 at Unknown time   Assessment: Pharmacy consulted to dose vancomycin in this 45 year old male with vertebral osteomyelitis.  Goal of Therapy:  Vancomycin trough level 15-20 mcg/ml  Plan:  Vancomycin trough is below goal. Will increase vancomycin to 1500 mg iv q 8 hours and check another trough with the 5th dose of this new regimen.   Luisa Hart D 07/07/2015,9:58 AM

## 2015-07-07 NOTE — Progress Notes (Signed)
CT scan results reviewed.   Discussed patient's medical condition, plan of treatment, prognosis at length with the wife over the phone. CT scan was done and was pending at this point. Explained reasoning for IV antibiotics and ultrasound thoracentesis.  Once I had the CT scan results I met patient and his wife  at bedside and discussed results of the CT scan as per radiology report. Patient is SCHEDULED for ultrasound thoracentesis tomorrow. Case was discussed with Dr. Pascal Lux with interventional radiology. Also Dr. Mortimer Fries with pulmonary is on board. Labs on the fluid have been ordered. We'll continue IV antibiotics. Blood cultures negative. We will discuss with Dr. Ola Spurr tomorrow.  Total additional time spent 35 minutes in reviewing radiology reports and discussing with family.

## 2015-07-08 ENCOUNTER — Inpatient Hospital Stay: Payer: BLUE CROSS/BLUE SHIELD

## 2015-07-08 ENCOUNTER — Encounter: Payer: Self-pay | Admitting: Family Medicine

## 2015-07-08 ENCOUNTER — Ambulatory Visit: Payer: BLUE CROSS/BLUE SHIELD

## 2015-07-08 LAB — BASIC METABOLIC PANEL
ANION GAP: 7 (ref 5–15)
BUN: 10 mg/dL (ref 6–20)
CALCIUM: 7.7 mg/dL — AB (ref 8.9–10.3)
CO2: 24 mmol/L (ref 22–32)
Chloride: 103 mmol/L (ref 101–111)
Creatinine, Ser: 0.86 mg/dL (ref 0.61–1.24)
GLUCOSE: 142 mg/dL — AB (ref 65–99)
Potassium: 2.9 mmol/L — CL (ref 3.5–5.1)
Sodium: 134 mmol/L — ABNORMAL LOW (ref 135–145)

## 2015-07-08 MED ORDER — POTASSIUM CHLORIDE 10 MEQ/100ML IV SOLN
10.0000 meq | INTRAVENOUS | Status: AC
  Administered 2015-07-08 (×4): 10 meq via INTRAVENOUS
  Filled 2015-07-08 (×4): qty 100

## 2015-07-08 MED ORDER — TEMAZEPAM 15 MG PO CAPS
15.0000 mg | ORAL_CAPSULE | Freq: Every evening | ORAL | Status: DC | PRN
  Administered 2015-07-08 – 2015-07-10 (×3): 15 mg via ORAL
  Filled 2015-07-08 (×3): qty 1

## 2015-07-08 MED ORDER — POTASSIUM CHLORIDE CRYS ER 20 MEQ PO TBCR
40.0000 meq | EXTENDED_RELEASE_TABLET | ORAL | Status: AC
  Administered 2015-07-08 (×2): 40 meq via ORAL
  Filled 2015-07-08 (×2): qty 2

## 2015-07-08 MED ORDER — SODIUM CHLORIDE 0.9 % IV SOLN
INTRAVENOUS | Status: DC
  Administered 2015-07-09: 12:00:00 via INTRAVENOUS

## 2015-07-08 NOTE — Progress Notes (Signed)
[facility] INFECTIOUS DISEASE PROGRESS NOTE Date of Admission:  07/02/2015     ID: Eugene Robinson is a 45 y.o. male with osteomyelitis T 11  Principal Problem:   CAP (community acquired pneumonia) Active Problems:   WPW (Wolff-Parkinson-White syndrome)   Back pain   GERD (gastroesophageal reflux disease)   Subjective: Had picc placed and cxr showed large R effusion, CT confirms, had attempted thora but could not obtain fluid. Has had fevers to 101. WBC 14 Has been on vanco and ctx ROS  Eleven systems are reviewed and negative except per hpi  Medications:  Antibiotics Given (last 72 hours)    Date/Time Action Medication Dose Rate   07/05/15 1701 Given   cefTRIAXone (ROCEPHIN) 2 g in dextrose 5 % 50 mL IVPB 2 g 100 mL/hr   07/05/15 1902 Given   vancomycin (VANCOCIN) 1,500 mg in sodium chloride 0.9 % 500 mL IVPB 1,500 mg 250 mL/hr   07/05/15 2117 Given   valACYclovir (VALTREX) tablet 1,000 mg 1,000 mg    07/05/15 2117 Given   cefTRIAXone (ROCEPHIN) 2 g in dextrose 5 % 50 mL IVPB 2 g 100 mL/hr   07/06/15 0010 Given   vancomycin (VANCOCIN) 1,250 mg in sodium chloride 0.9 % 250 mL IVPB 1,250 mg 166.7 mL/hr   07/06/15 0918 Given   cefTRIAXone (ROCEPHIN) 2 g in dextrose 5 % 50 mL IVPB 2 g 100 mL/hr   07/06/15 0918 Given   vancomycin (VANCOCIN) 1,250 mg in sodium chloride 0.9 % 250 mL IVPB 1,250 mg 166.7 mL/hr   07/06/15 0919 Given   valACYclovir (VALTREX) tablet 1,000 mg 1,000 mg    07/06/15 1803 Given   valACYclovir (VALTREX) tablet 1,000 mg 1,000 mg    07/06/15 1803 Given   vancomycin (VANCOCIN) 1,250 mg in sodium chloride 0.9 % 250 mL IVPB 1,250 mg 166.7 mL/hr   07/06/15 1947 Given   valACYclovir (VALTREX) tablet 1,000 mg 1,000 mg    07/06/15 1948 Given   cefTRIAXone (ROCEPHIN) 2 g in dextrose 5 % 50 mL IVPB 2 g 100 mL/hr   07/07/15 0042 Given   vancomycin (VANCOCIN) 1,250 mg in sodium chloride 0.9 % 250 mL IVPB 1,250 mg 166.7 mL/hr   07/07/15 0931 Given   valACYclovir  (VALTREX) tablet 1,000 mg 1,000 mg    07/07/15 0931 Given   cefTRIAXone (ROCEPHIN) 2 g in dextrose 5 % 50 mL IVPB 2 g 100 mL/hr   07/07/15 0931 Given   vancomycin (VANCOCIN) 1,250 mg in sodium chloride 0.9 % 250 mL IVPB 1,250 mg 166.7 mL/hr   07/07/15 1610 Given   valACYclovir (VALTREX) tablet 1,000 mg 1,000 mg    07/07/15 1611 Given   vancomycin (VANCOCIN) 1,500 mg in sodium chloride 0.9 % 500 mL IVPB 1,500 mg 250 mL/hr   07/07/15 2151 Given   cefTRIAXone (ROCEPHIN) 2 g in dextrose 5 % 50 mL IVPB 2 g 100 mL/hr   07/07/15 2154 Given   valACYclovir (VALTREX) tablet 1,000 mg 1,000 mg    07/08/15 0234 Given  [pc]   vancomycin (VANCOCIN) 1,500 mg in sodium chloride 0.9 % 500 mL IVPB 1,500 mg 250 mL/hr   07/08/15 0943 Given   valACYclovir (VALTREX) tablet 1,000 mg 1,000 mg    07/08/15 1050 Given   vancomycin (VANCOCIN) 1,500 mg in sodium chloride 0.9 % 500 mL IVPB 1,500 mg 250 mL/hr     . cefTRIAXone (ROCEPHIN)  IV  2 g Intravenous Q12H  . enoxaparin (LOVENOX) injection  40 mg  Subcutaneous Q24H  . metoprolol  50 mg Oral BID  . senna-docusate  2 tablet Oral BID  . sodium chloride flush  10-40 mL Intracatheter Q12H  . sodium chloride flush  3 mL Intravenous Q12H  . vancomycin  1,500 mg Intravenous Q8H    Objective: Vital signs in last 24 hours: Temp:  [98.1 F (36.7 C)-99 F (37.2 C)] 99 F (37.2 C) (03/27 1139) Pulse Rate:  [78-95] 95 (03/27 1427) Resp:  [18-20] 20 (03/27 1427) BP: (111-137)/(58-71) 111/58 mmHg (03/27 1427) SpO2:  [93 %-96 %] 96 % (03/27 1427) Constitutional: He is oriented to person, place, and time. Obese, in pain HENT: Perrla eomi, anicteric Mouth/Throat: Oropharynx is clear and dry. No oropharyngeal exudate.  Cardiovascular: Normal rate, regular rhythm and normal heart sounds.  Pulmonary/Chest: Effort normal and breath sounds normal. Dec BS R base Abdominal: distended. Bowel sounds are normal. There is no tenderness.  Lymphadenopathy: He has no cervical  adenopathy.  Neurological: He is alert and oriented to person, place, and time.  Skin: Skin is warm and dry. R back with one large blister (2 cm) and several healing spots No stigmata of endocarditis Psychiatric: He has a normal mood and affect. His behavior is normal.  EXT no cce  Lab Results  Recent Labs  07/07/15 0522 07/08/15 0458  WBC 14.4*  --   HGB 12.6*  --   HCT 37.0*  --   NA 132* 134*  K 2.9* 2.9*  CL 100* 103  CO2 26 24  BUN 9 10  CREATININE 0.95 0.86    Microbiology: Results for orders placed or performed during the hospital encounter of 07/02/15  Blood culture (routine x 2)     Status: None (Preliminary result)   Collection Time: 07/02/15  9:08 PM  Result Value Ref Range Status   Specimen Description BLOOD RIGHT HAND  Final   Special Requests   Final    BOTTLES DRAWN AEROBIC AND ANAEROBIC 5MLANAEROBIC, 10MLAEROBIC   Culture NO GROWTH 4 DAYS  Final   Report Status PENDING  Incomplete  Blood culture (routine x 2)     Status: None (Preliminary result)   Collection Time: 07/02/15  9:08 PM  Result Value Ref Range Status   Specimen Description BLOOD RIGHT ANTECUBITAL  Final   Special Requests BOTTLES DRAWN AEROBIC AND ANAEROBIC 5ML  Final   Culture NO GROWTH 4 DAYS  Final   Report Status PENDING  Incomplete  Urine culture     Status: None   Collection Time: 07/03/15  2:19 AM  Result Value Ref Range Status   Specimen Description URINE, CLEAN CATCH  Final   Special Requests NONE  Final   Culture NO GROWTH 1 DAY  Final   Report Status 07/04/2015 FINAL  Final  Culture, routine-abscess     Status: None (Preliminary result)   Collection Time: 07/05/15  2:25 PM  Result Value Ref Range Status   Specimen Description ABSCESS  Final   Special Requests NONE  Final   Gram Stain   Final    MANY WBC SEEN MANY RED BLOOD CELLS NO ORGANISMS SEEN    Culture NO GROWTH 3 DAYS  Final   Report Status PENDING  Incomplete    Studies/Results: Dg Chest 1  View  07/08/2015  CLINICAL DATA:  Post right thoracentesis. Loculated right pleural effusion and no fluid could be aspirated. EXAM: CHEST 1 VIEW COMPARISON:  Chest CT 07/07/2015 FINDINGS: Diffuse densities throughout the entire right hemithorax compatible with a complex  right pleural effusion and associated volume loss. There is no evidence for a pneumothorax. Left lung remains clear. The trachea is midline. Heart and mediastinum are within normal limits and stable. PICC line tip in the SVC region. IMPRESSION: Persistent opacities throughout the right chest compatible with a complex right pleural effusion and associated volume loss. Negative for pneumothorax following the attempted right thoracentesis. Electronically Signed   By: Markus Daft M.D.   On: 07/08/2015 15:25   Ct Chest Wo Contrast  07/07/2015  CLINICAL DATA:  45 year old male with lower thoracic spinal infection. Right pleural effusion. Initial encounter. EXAM: CT CHEST WITHOUT CONTRAST TECHNIQUE: Multidetector CT imaging of the chest was performed following the standard protocol without IV contrast. COMPARISON:  Portable chest 07/05/2015 and earlier. FINDINGS: Moderate size mostly layering right pleural effusion. Some fluid tracks into the right major fissure. No gas identified within the right pleural collection. Moderate to severe compressive atelectasis on the right lower lobe, plus superimposed right lower lobe consolidation with air bronchograms. No definite cavitary changes of the right lung. The upper lobe is relatively spared. No pericardial effusion. No definite mediastinal or hilar lymphadenopathy. Left side PICC line in place. Negative noncontrast thoracic inlet. No axillary lymphadenopathy. Major airways are patent. The left lung is clear aside from mild curvilinear scarring or atelectasis at the left lung base. No upper abdominal free air or free fluid. Negative visualized noncontrast liver, gallbladder, spleen, pancreas, adrenal glands,  bowel, and right renal upper pole. Lower thoracic anterior and right paraspinal phlegmon re- demonstrated with epicenter at T11-T12 (series 2, image 52). No destructive vertebral osseous changes at this time. No acute or suspicious osseous lesion identified. IMPRESSION: 1. Moderate size layering right pleural effusion with compressive atelectasis of the right lung, but superimposed right lower lobe consolidation is suspicious for right lower lobe pneumonia in this setting. 2. The left lung is spared. 3. Lower thoracic spine paraspinal phlegmon re - demonstrated at T11- T12. No destructive osseous changes at this time. Electronically Signed   By: Genevie Ann M.D.   On: 07/07/2015 12:49   US Thoracentesis Asp Pleural Space W/img Guide  07/08/2015  INDICATION: 45 year old with a right pleural effusion and spinal osteomyelitis. EXAM: ULTRASOUND GUIDED RIGHT THORACENTESIS MEDICATIONS: None. COMPLICATIONS: None immediate. PROCEDURE: An ultrasound guided thoracentesis was thoroughly discussed with the patient and questions answered. The benefits, risks, alternatives and complications were also discussed. The patient understands and wishes to proceed with the procedure. Written consent was obtained. Ultrasound was performed to localize and mark an adequate pocket of fluid in the right posterior chest. The area was then prepped and draped in the normal sterile fashion. 1% Lidocaine was used for local anesthesia. Under ultrasound guidance a Safe-T-Centesis catheter was introduced. No fluid could be aspirated. A Yueh catheter was then directed into the right pleural space with real-time ultrasound guidance. Again, no fluid could be aspirated. The catheter was removed and a dressing applied. FINDINGS: Complex loculated right pleural effusion. Catheters directed into the right pleural space but no fluid could be aspirated. IMPRESSION: Unsuccessful ultrasound-guided right thoracentesis due to complex loculated right pleural  effusion. Electronically Signed   By: Markus Daft M.D.   On: 07/08/2015 15:29   Left ventricle: The cavity size was normal. Systolic function was  normal. The estimated ejection fraction was in the range of 60%  to 65%. Wall motion was normal; there were no regional wall  motion abnormalities. Left ventricular diastolic function  parameters were normal. - Mitral valve: There  was mild regurgitation. - Left atrium: The atrium was mildly dilated. - Right ventricle: Systolic function was normal. - Pulmonary arteries: Systolic pressure was within the normal  range.  Impressions:  - No valve vegetation noted.   Assessment/Plan: Eugene Robinson is a 45 y.o. 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 He presents acutely over about 5-7 days with no obvious predisposing source of infection. His bcx  are negative.HIV neg, ESR 68, CRP 18 TTE neg  He had aspiration of 2 cc blood tinged yellow purulent fluid by IR however unfortunately cxs remain negative. Back pain much better. Now also with R pleural effusion which appears to be loculated   REC Cont vanco Change ceftraixone - if febrile again would change to ceftazidime Consult Dr Genevive Bi- ? If direct communication between Spine infection and pleura? Thank you very much for the consult. Will follow with you.  Capria Cartaya P   07/08/2015, 3:51 PM

## 2015-07-08 NOTE — Progress Notes (Addendum)
[facility]     Assessment and Plan:  Large right-sided pleural effusion, with compressive atelectasis. -Thoracentesis is planned for today, we'll await results.  Pneumonia versus atelectasis. -Await results of thoracentesis and other testing, we will defer antibiotic choice. 2. Infectious disease service, which is also following.  Spinal abscess/osteomyelitis. -Status post drainage.  Addendum: The case was discussed with the radiologist as well as the hospitalist physician. A thoracentesis was attempted by interventional radiology, however, no fluid could be obtained, due to multiple loculations. We will consult cardiothoracic surgery for further management, which may include a chest tube placement and/or VATS procedure.  Date: 07/08/2015  MRN# [id number] Eugene Robinson September 25, 1970   Eugene Robinson is a 45 y.o. old male seen in follow up for chief complaint of  Chief Complaint  Patient presents with  . Flank Pain     HPI:   Patient continues to complain of right-sided chest discomfort and congestion.  Review of CT  images and reports from 07/07/2015, 07/02/2015, 06/28/2015. There is a moderate to large right-sided pleural effusion, with a large amount of atelectasis/pneumonia underneath. Some of this area appears heterogenous in nature, which could be indicative of loculation/infection. On the previous images. There did appear to be some pleural thickening with no evidence of effusion at that time.   Allergies:  Review of patient's allergies indicates no known allergies.  Review of Systems: Gen:  Denies  fever, sweats. HEENT: Denies blurred vision. Cvc:  No dizziness, chest pain or heaviness Resp:   Denies cough or sputum porduction. Gi: Denies swallowing difficulty, stomach pain. constipation, bowel incontinence Gu:  Denies bladder incontinence, burning urine Ext:   No Joint pain, stiffness. Skin: No skin rash, easy bruising. Endoc:  No polyuria,  polydipsia. Psych: No depression, insomnia. Other:  All other systems were reviewed and found to be negative other than what is mentioned in the HPI.   Physical Examination:   VS: BP 137/62 mmHg  Pulse 89  Temp(Src) 99 F (37.2 C) (Oral)  Resp 20  Ht  (1.778 m)  Wt 255 lb 6.4 oz (115.849 kg)  BMI 36.65 kg/m2  SpO2 95%  General Appearance: No distress  Neuro:without focal findings,  speech normal,  HEENT: PERRLA, EOM intact. Pulmonary: Decreased breath sounds in the right lung, with dullness to percussion. CardiovascularNormal S1,S2.  No m/r/g.   Abdomen: Benign, Soft, non-tender. Renal:  No costovertebral tenderness  GU:  Not performed at this time. Endoc: No evident thyromegaly, no signs of acromegaly. Skin:   warm, no rash. Extremities: normal, no cyanosis, clubbing.   LABORATORY PANEL:   CBC  Recent Labs Lab 07/07/15 0522  WBC 14.4*  HGB 12.6*  HCT 37.0*  PLT 354   ------------------------------------------------------------------------------------------------------------------  Chemistries   Recent Labs Lab 07/02/15 2108  07/07/15 0818 07/08/15 0458  NA 132*  < >  --  134*  K 3.6  < >  --  2.9*  CL 98*  < >  --  103  CO2 25  < >  --  24  GLUCOSE 99  < >  --  142*  BUN 21*  < >  --  10  CREATININE 1.24  < >  --  0.86  CALCIUM 7.8*  < >  --  7.7*  MG  --   --  1.8  --   AST 29  --   --   --   ALT 32  --   --   --  ALKPHOS 138*  --   --   --   BILITOT 1.2  --   --   --   < > = values in this interval not displayed. ------------------------------------------------------------------------------------------------------------------  Cardiac Enzymes No results for input(s): TROPONINI in the last 168 hours. ------------------------------------------------------------  RADIOLOGY:   No results found for this or any previous visit. No results found for this or any previous  visit. ------------------------------------------------------------------------------------------------------------------  Thank  you for allowing [facility] to assist in the care of your patient. Our recommendations are noted above.  Please contact us if we can be of further service.   Eugene Guileseep Heran Campau, MD.  [facility] Office Number: [phone]  Eugene Robinson, M.D.  Eugene Robinson, M.D.  Eugene Robinson, M.D  07/08/2015

## 2015-07-08 NOTE — Care Management (Addendum)
Primary nurse providing care today did not receive information in handoff regarding teach back with wife for IV administration/flush.Wife has not hand instruction over the weekend.   Discussed with primary nurse that patient's wife wishes to have hands on experience with administration of IV meds/flush.  Barrier to discharge - requiring thoracentesis  for  right pleural effusion.  At present time, it is anticipated patient to discharge 3/25.  Notified [facility] and [facility].  Anticipate patient will remain on the vancomycin and ceftriaxone.

## 2015-07-08 NOTE — Procedures (Signed)
US guided thoracentesis of right chest.  No fluid was able to be aspirated.  US demonstrates a loculated effusion.

## 2015-07-08 NOTE — Progress Notes (Signed)
MD, Sheryle Hailiamond aware of potasium level of 2.9, new orders have been placed by MD. Will continue to monitor.

## 2015-07-08 NOTE — Progress Notes (Signed)
Interventional radiology couldn't get any fluid to thoracentesis due to complex loculated effusion on ultrasound. Discussed with pulmonary and Dr. Nicholos Johnsamachandran regarding thoracentesis attempt. Advised consult in thoracic surgery Dr. Inez Catalinaakes. Discussed with Dr. Inez Catalinaakes who has reviewed the CT scan and suggested chest tube be placed by interventional radiology for TPA. I have called Dr. Lowella DandyHenn of interventional radiology who will try to do the procedure tomorrow after discussing with Dr. Thelma Bargeaks.

## 2015-07-08 NOTE — Progress Notes (Signed)
[facility] [place] [facility]   PATIENT NAME: Eugene Robinson    MR#:  [id number]  DATE OF BIRTH:  12-01-1970  SUBJECTIVE:  CHIEF COMPLAINT:   Chief Complaint  Patient presents with  . Flank Pain   Admitted for back and flank pain. Started on vancomycin and Zosyn. No weakness or tingling in lower extremity's. S/p CT-guided paravertebral abscess drainage on 07/05/2015 PICC line placed on 07/05/2015.  Back pain improved. Continues to have tightness on right side of the chest with some shortness of breath on ambulation. Ultrasound thoracentesis later today. REVIEW OF SYSTEMS:    Review of Systems  Constitutional: Positive for fever, chills and malaise/fatigue.  HENT: Negative for sore throat.   Eyes: Negative for blurred vision, double vision and pain.  Respiratory: Negative for cough, hemoptysis, shortness of breath and wheezing.   Cardiovascular: Negative for chest pain, palpitations, orthopnea and leg swelling.  Gastrointestinal: Positive for constipation. Negative for heartburn, nausea, vomiting, abdominal pain and diarrhea.  Genitourinary: Negative for dysuria and hematuria.  Musculoskeletal: Positive for back pain. Negative for joint pain.  Skin: Negative for rash.  Neurological: Positive for weakness. Negative for sensory change, speech change, focal weakness and headaches.  Endo/Heme/Allergies: Does not bruise/bleed easily.  Psychiatric/Behavioral: Negative for depression. The patient is not nervous/anxious.     DRUG ALLERGIES:  No Known Allergies  VITALS:  Blood pressure 127/63, pulse 78, temperature 98.1 F (36.7 C), temperature source Oral, resp. rate 20, height  (1.778 m), weight 115.849 kg (255 lb 6.4 oz), SpO2 95 %.  PHYSICAL EXAMINATION:   Physical Exam  GENERAL:  45 y.o.-year-old patient lying in the bed.  EYES: Pupils equal, round, reactive to light and accommodation. No scleral icterus. Extraocular muscles intact.   HEENT: Head atraumatic, normocephalic. Oropharynx and nasopharynx clear.  NECK:  Supple, no jugular venous distention. No thyroid enlargement, no tenderness.  LUNGS:  Decreased breath sounds right side. Clear to auscultation CARDIOVASCULAR: S1, S2 normal. No murmurs, rubs, or gallops.  ABDOMEN: Soft, nontender, nondistended. Bowel sounds present. No organomegaly or mass.  EXTREMITIES: No cyanosis, clubbing or edema b/l.    NEUROLOGIC: Cranial nerves II through XII are intact. No focal Motor or sensory deficits b/l.   PSYCHIATRIC: The patient is alert and oriented x 3.  SKIN: No obvious rash, lesion, or ulcer.   LABORATORY PANEL:   CBC  Recent Labs Lab 07/07/15 0522  WBC 14.4*  HGB 12.6*  HCT 37.0*  PLT 354   ------------------------------------------------------------------------------------------------------------------ Chemistries   Recent Labs Lab 07/02/15 2108  07/07/15 0818 07/08/15 0458  NA 132*  < >  --  134*  K 3.6  < >  --  2.9*  CL 98*  < >  --  103  CO2 25  < >  --  24  GLUCOSE 99  < >  --  142*  BUN 21*  < >  --  10  CREATININE 1.24  < >  --  0.86  CALCIUM 7.8*  < >  --  7.7*  MG  --   --  1.8  --   AST 29  --   --   --   ALT 32  --   --   --   ALKPHOS 138*  --   --   --   BILITOT 1.2  --   --   --   < > = values in this interval not displayed. ------------------------------------------------------------------------------------------------------------------  Cardiac Enzymes No results for  input(s): TROPONINI in the last 168 hours. ------------------------------------------------------------------------------------------------------------------  RADIOLOGY:  Ct Chest Wo Contrast  07/07/2015  CLINICAL DATA:  45 year old male with lower thoracic spinal infection. Right pleural effusion. Initial encounter. EXAM: CT CHEST WITHOUT CONTRAST TECHNIQUE: Multidetector CT imaging of the chest was performed following the standard protocol without IV contrast.  COMPARISON:  Portable chest 07/05/2015 and earlier. FINDINGS: Moderate size mostly layering right pleural effusion. Some fluid tracks into the right major fissure. No gas identified within the right pleural collection. Moderate to severe compressive atelectasis on the right lower lobe, plus superimposed right lower lobe consolidation with air bronchograms. No definite cavitary changes of the right lung. The upper lobe is relatively spared. No pericardial effusion. No definite mediastinal or hilar lymphadenopathy. Left side PICC line in place. Negative noncontrast thoracic inlet. No axillary lymphadenopathy. Major airways are patent. The left lung is clear aside from mild curvilinear scarring or atelectasis at the left lung base. No upper abdominal free air or free fluid. Negative visualized noncontrast liver, gallbladder, spleen, pancreas, adrenal glands, bowel, and right renal upper pole. Lower thoracic anterior and right paraspinal phlegmon re- demonstrated with epicenter at T11-T12 (series 2, image 52). No destructive vertebral osseous changes at this time. No acute or suspicious osseous lesion identified. IMPRESSION: 1. Moderate size layering right pleural effusion with compressive atelectasis of the right lung, but superimposed right lower lobe consolidation is suspicious for right lower lobe pneumonia in this setting. 2. The left lung is spared. 3. Lower thoracic spine paraspinal phlegmon re - demonstrated at T11- T12. No destructive osseous changes at this time. Electronically Signed   By: Odessa FlemingH  Hall M.D.   On: 07/07/2015 12:49     ASSESSMENT AND PLAN:   * Large right-sided pleural effusion Incidental finding on chest x-ray done for PICC line placement Discussed Dr. Belia HemanKasa of pulmonary. Ordered an ultrasound thoracentesis. CT scan of the chest showed moderate right pleural effusion with infiltrate and compressive atelectasis. Discussed with Dr. Lowella DandyHenn of interventional radiology and procedures scheduled  for today Labs ordered Other management as per thoracentesis results.  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 Sample sent to the lab. Cultures no growth to date On IV vancomycin and ceftriaxone. Blood cultures no growth to date. PICC line in place Echocardiogram showed no endocarditis Afebrile now  *  Wolff-Parkinson-White syndrome is stable  * DVT prophylaxis with Lovenox  All the records are reviewed and case discussed with Care Management/Social Workerr. Management plans discussed with the patient, family and they are in agreement.  CODE STATUS: FULL CODE  DVT Prophylaxis: SCDs  TOTAL TIME TAKING CARE OF THIS PATIENT: 35 minutes.   POSSIBLE D/C IN 1-2 DAYS, DEPENDING ON CLINICAL CONDITION.   Milagros LollSudini, Maricia Scotti R M.D on 07/08/2015 at 11:07 AM  Between 7am to 6pm - Pager - [phone]  After 6pm go to [email or web address] - password [email or web address] [facility]  [facility]  Office  [phone]  CC: Primary care physician; Tommie SamsJayce G Cook, DO  Note: This dictation was prepared with Dragon dictation along with smaller phrase technology. Any transcriptional errors that result from this process are unintentional.

## 2015-07-09 ENCOUNTER — Encounter: Payer: Self-pay | Admitting: Family Medicine

## 2015-07-09 ENCOUNTER — Inpatient Hospital Stay: Payer: BLUE CROSS/BLUE SHIELD

## 2015-07-09 ENCOUNTER — Encounter: Payer: Self-pay | Admitting: Radiology

## 2015-07-09 ENCOUNTER — Telehealth: Payer: Self-pay | Admitting: Family Medicine

## 2015-07-09 DIAGNOSIS — J90 Pleural effusion, not elsewhere classified: Secondary | ICD-10-CM | POA: Insufficient documentation

## 2015-07-09 DIAGNOSIS — J948 Other specified pleural conditions: Secondary | ICD-10-CM

## 2015-07-09 LAB — BASIC METABOLIC PANEL
Anion gap: 7 (ref 5–15)
BUN: 8 mg/dL (ref 6–20)
CHLORIDE: 106 mmol/L (ref 101–111)
CO2: 22 mmol/L (ref 22–32)
CREATININE: 0.84 mg/dL (ref 0.61–1.24)
Calcium: 7.8 mg/dL — ABNORMAL LOW (ref 8.9–10.3)
GFR calc Af Amer: >60 mL/min (ref >60)
GFR calc non Af Amer: >60 mL/min (ref >60)
Glucose, Bld: 97 mg/dL (ref 65–99)
POTASSIUM: 3.4 mmol/L — AB (ref 3.5–5.1)
SODIUM: 135 mmol/L (ref 135–145)

## 2015-07-09 LAB — CBC
HEMATOCRIT: 35.8 % — AB (ref 40.0–52.0)
Hemoglobin: 11.8 g/dL — ABNORMAL LOW (ref 13.0–18.0)
MCH: 28.1 pg (ref 26.0–34.0)
MCHC: 33 g/dL (ref 32.0–36.0)
MCV: 85.1 fL (ref 80.0–100.0)
PLATELETS: 412 10*3/uL (ref 150–440)
RBC: 4.21 MIL/uL — ABNORMAL LOW (ref 4.40–5.90)
RDW: 13.7 % (ref 11.5–14.5)
WBC: 12.9 10*3/uL — AB (ref 3.8–10.6)

## 2015-07-09 LAB — CULTURE, ROUTINE-ABSCESS: Culture: NO GROWTH

## 2015-07-09 LAB — VANCOMYCIN, TROUGH: Vancomycin Tr: 17 ug/mL (ref 10–20)

## 2015-07-09 MED ORDER — FENTANYL CITRATE (PF) 100 MCG/2ML IJ SOLN
INTRAMUSCULAR | Status: AC
  Filled 2015-07-09: qty 4

## 2015-07-09 MED ORDER — ENOXAPARIN SODIUM 40 MG/0.4ML ~~LOC~~ SOLN
40.0000 mg | SUBCUTANEOUS | Status: DC
  Administered 2015-07-09 – 2015-07-11 (×3): 40 mg via SUBCUTANEOUS
  Filled 2015-07-09 (×3): qty 0.4

## 2015-07-09 MED ORDER — SODIUM CHLORIDE 0.9 % IJ SOLN
Freq: Once | INTRAMUSCULAR | Status: AC
  Administered 2015-07-10: 07:00:00 via INTRAPLEURAL
  Filled 2015-07-09: qty 10

## 2015-07-09 MED ORDER — MIDAZOLAM HCL 5 MG/5ML IJ SOLN
INTRAMUSCULAR | Status: AC | PRN
  Administered 2015-07-09 (×3): 1 mg via INTRAVENOUS

## 2015-07-09 MED ORDER — MIDAZOLAM HCL 5 MG/5ML IJ SOLN
INTRAMUSCULAR | Status: AC
  Filled 2015-07-09: qty 10

## 2015-07-09 MED ORDER — CALCIUM CARBONATE ANTACID 500 MG PO CHEW
1.0000 | CHEWABLE_TABLET | ORAL | Status: DC | PRN
  Administered 2015-07-09: 200 mg via ORAL
  Filled 2015-07-09: qty 1

## 2015-07-09 MED ORDER — FENTANYL CITRATE (PF) 100 MCG/2ML IJ SOLN
INTRAMUSCULAR | Status: AC | PRN
  Administered 2015-07-09: 25 ug via INTRAVENOUS
  Administered 2015-07-09: 50 ug via INTRAVENOUS

## 2015-07-09 NOTE — Telephone Encounter (Signed)
Pt wife came in and dropped off FMLA paperwork.. Placed in Dr. Shiela Mayerooks Box.. Please advise pt when completed

## 2015-07-09 NOTE — Sedation Documentation (Signed)
230 ml fluid aspirated post # 14 French multipurpose drain insertion.  Specimen to lab.

## 2015-07-09 NOTE — Progress Notes (Signed)
ANTIBIOTIC CONSULT NOTE - INITIAL  Pharmacy Consult for Vancomycin  Indication: vertebral osteomyelitis  No Known Allergies  Patient Measurements: Height:  (177.8 cm) Weight: 255 lb 6.4 oz (115.849 kg) IBW/kg (Calculated) : 73 Adjusted Body Weight: 90.12 kg   Vital Signs: Temp: 98.3 F (36.8 C) (03/28 0518) Temp Source: Oral (03/28 0518) BP: 131/69 mmHg (03/28 0518) Pulse Rate: 83 (03/28 0518) Intake/Output from previous day: 03/27 0701 - 03/28 0700 In: 240 [P.O.:240] Out: 2100 [Urine:2100] Intake/Output from this shift: Total I/O In: -  Out: 1300 [Urine:1300]  Labs:  Recent Labs  07/06/15 0634 07/07/15 0522 07/08/15 0458  WBC  --  14.4*  --   HGB  --  12.6*  --   PLT  --  354  --   CREATININE 1.03 0.95 0.86   Estimated Creatinine Clearance: 136.8 mL/min (by C-G formula based on Cr of 0.86).  Recent Labs  07/07/15 0818 07/09/15 0030  VANCOTROUGH 12 17     Microbiology: Recent Results (from the past 720 hour(s))  Blood culture (routine x 2)     Status: None (Preliminary result)   Collection Time: 07/02/15  9:08 PM  Result Value Ref Range Status   Specimen Description BLOOD RIGHT HAND  Final   Special Requests   Final    BOTTLES DRAWN AEROBIC AND ANAEROBIC ,   Culture NO GROWTH 4 DAYS  Final   Report Status PENDING  Incomplete  Blood culture (routine x 2)     Status: None (Preliminary result)   Collection Time: 07/02/15  9:08 PM  Result Value Ref Range Status   Specimen Description BLOOD RIGHT ANTECUBITAL  Final   Special Requests BOTTLES DRAWN AEROBIC AND ANAEROBIC  Final   Culture NO GROWTH 4 DAYS  Final   Report Status PENDING  Incomplete  Urine culture     Status: None   Collection Time: 07/03/15  2:19 AM  Result Value Ref Range Status   Specimen Description URINE, CLEAN CATCH  Final   Special Requests NONE  Final   Culture NO GROWTH 1 DAY  Final   Report Status 07/04/2015 FINAL  Final  Culture,  routine-abscess     Status: None (Preliminary result)   Collection Time: 07/05/15  2:25 PM  Result Value Ref Range Status   Specimen Description ABSCESS  Final   Special Requests NONE  Final   Gram Stain   Final    MANY WBC SEEN MANY RED BLOOD CELLS NO ORGANISMS SEEN    Culture NO GROWTH 3 DAYS  Final   Report Status PENDING  Incomplete    Medical History: Past Medical History  Diagnosis Date  . WPW (Wolff-Parkinson-White syndrome)   . Chicken pox   . GERD (gastroesophageal reflux disease)   . Kidney stones   . PVC's (premature ventricular contractions) 03/19/2015    Medications:  Prescriptions prior to admission  Medication Sig Dispense Refill Last Dose  . ciprofloxacin (CIPRO) 500 MG tablet Take 1 tablet by mouth 2 (two) times daily.   Past Month at Unknown time  . ibuprofen (ADVIL,MOTRIN) 200 MG tablet Take 200 mg by mouth every 6 (six) hours as needed.    PRN at PRN  . metoprolol (LOPRESSOR) 50 MG tablet Take 1 tablet (50 mg total) by mouth 2 (two) times daily. 60 tablet 11 07/01/2015 at Unknown time  . naproxen (NAPROSYN) 500 MG tablet Take 1 tablet by mouth 2 (two) times daily.   07/02/2015 at Unknown time  .  ondansetron (ZOFRAN) 4 MG tablet Take 1 tablet (4 mg total) by mouth every 8 (eight) hours as needed for nausea or vomiting. 20 tablet 0 PRN at PRN  . oxyCODONE-acetaminophen (ROXICET) 5-325 MG tablet Take 1 tablet by mouth every 6 (six) hours as needed for severe pain. 12 tablet 0 PRN at PRN  . valACYclovir (VALTREX) 1000 MG tablet Take 1 tablet (1,000 mg total) by mouth 3 (three) times daily. 21 tablet 0 07/01/2015 at Unknown time   Assessment: Pharmacy consulted to dose vancomycin in this 45 year old male with vertebral osteomyelitis.  Goal of Therapy:  Vancomycin trough level 15-20 mcg/ml  Plan:  Vancomycin trough 17 mcg/mL - therapeutic. Continue current regimen. Pharmacy will continue to monitor and adjust as needed to maintain trough 15 to 20 mcg/mL.  Carola FrostNathan  A Riannon Mukherjee, Pharm.D., BCPS Clinical Pharmacist 07/09/2015,6:28 AM

## 2015-07-09 NOTE — Progress Notes (Signed)
[facility] [place] [facility]   PATIENT NAME: Eugene Robinson    MR#:  [id number]  DATE OF BIRTH:  03-06-1971  SUBJECTIVE:  CHIEF COMPLAINT:   Chief Complaint  Patient presents with  . Flank Pain   Admitted for back and flank pain. Started on vancomycin and Zosyn. S/p CT-guided paravertebral abscess drainage on 07/05/2015 PICC line placed on 07/05/2015. Changed to Vanc + ceftriaxone Ultrasound-guided right thoracentesis couldn't drain any fluid 07/09/2015.  Back pain is resolved Continues to have tightness on right side of the chest with some shortness of breath on ambulation.  Right chest pigtail catheter to be placed later today for TPA REVIEW OF SYSTEMS:    Review of Systems  Constitutional: Positive for fever, chills and malaise/fatigue.  HENT: Negative for sore throat.   Eyes: Negative for blurred vision, double vision and pain.  Respiratory: Negative for cough, hemoptysis, shortness of breath and wheezing.   Cardiovascular: Negative for chest pain, palpitations, orthopnea and leg swelling.  Gastrointestinal: Positive for constipation. Negative for heartburn, nausea, vomiting, abdominal pain and diarrhea.  Genitourinary: Negative for dysuria and hematuria.  Musculoskeletal: Positive for back pain. Negative for joint pain.  Skin: Negative for rash.  Neurological: Positive for weakness. Negative for sensory change, speech change, focal weakness and headaches.  Endo/Heme/Allergies: Does not bruise/bleed easily.  Psychiatric/Behavioral: Negative for depression. The patient is not nervous/anxious.     DRUG ALLERGIES:  No Known Allergies  VITALS:  Blood pressure 131/69, pulse 83, temperature 98.3 F (36.8 C), temperature source Oral, resp. rate 20, height 5\' 10"  (1.778 m), weight 115.849 kg (255 lb 6.4 oz), SpO2 97 %.  PHYSICAL EXAMINATION:   Physical Exam  GENERAL:  45 y.o.-year-old patient lying in the bed.  EYES: Pupils equal, round,  reactive to light and accommodation. No scleral icterus. Extraocular muscles intact.  HEENT: Head atraumatic, normocephalic. Oropharynx and nasopharynx clear.  NECK:  Supple, no jugular venous distention. No thyroid enlargement, no tenderness.  LUNGS:  Decreased breath sounds right side. Clear to auscultation CARDIOVASCULAR: S1, S2 normal. No murmurs, rubs, or gallops.  ABDOMEN: Soft, nontender, nondistended. Bowel sounds present. No organomegaly or mass.  EXTREMITIES: No cyanosis, clubbing or edema b/l.    NEUROLOGIC: Cranial nerves II through XII are intact. No focal Motor or sensory deficits b/l.   PSYCHIATRIC: The patient is alert and oriented x 3.  SKIN: No obvious rash, lesion, or ulcer.   LABORATORY PANEL:   CBC  Recent Labs Lab 07/09/15 0123  WBC 12.9*  HGB 11.8*  HCT 35.8*  PLT 412   ------------------------------------------------------------------------------------------------------------------ Chemistries   Recent Labs Lab 07/02/15 2108  07/07/15 0818  07/09/15 0123  NA 132*  < >  --   < > 135  K 3.6  < >  --   < > 3.4*  CL 98*  < >  --   < > 106  CO2 25  < >  --   < > 22  GLUCOSE 99  < >  --   < > 97  BUN 21*  < >  --   < > 8  CREATININE 1.24  < >  --   < > 0.84  CALCIUM 7.8*  < >  --   < > 7.8*  MG  --   --  1.8  --   --   AST 29  --   --   --   --   ALT 32  --   --   --   --  ALKPHOS 138*  --   --   --   --   BILITOT 1.2  --   --   --   --   < > = values in this interval not displayed. ------------------------------------------------------------------------------------------------------------------  Cardiac Enzymes No results for input(s): TROPONINI in the last 168 hours. ------------------------------------------------------------------------------------------------------------------  RADIOLOGY:  Dg Chest 1 View  07/08/2015  CLINICAL DATA:  Post right thoracentesis. Loculated right pleural effusion and no fluid could be aspirated. EXAM: CHEST 1  VIEW COMPARISON:  Chest CT 07/07/2015 FINDINGS: Diffuse densities throughout the entire right hemithorax compatible with a complex right pleural effusion and associated volume loss. There is no evidence for a pneumothorax. Left lung remains clear. The trachea is midline. Heart and mediastinum are within normal limits and stable. PICC line tip in the SVC region. IMPRESSION: Persistent opacities throughout the right chest compatible with a complex right pleural effusion and associated volume loss. Negative for pneumothorax following the attempted right thoracentesis. Electronically Signed   By: Richarda Overlie M.D.   On: 07/08/2015 15:25   Dg Chest 2 View  07/09/2015  CLINICAL DATA:  Spine infection and right-sided pleural effusion EXAM: CHEST  2 VIEW COMPARISON:  07/08/2015 FINDINGS: Stable right-sided pleural effusion is noted. The cardiac shadow is stable. Left lung is clear. No acute bony abnormality is noted. IMPRESSION: Stable changes on the right. Electronically Signed   By: Alcide Clever M.D.   On: 07/09/2015 07:49   Ct Chest Wo Contrast  07/07/2015  CLINICAL DATA:  45 year old male with lower thoracic spinal infection. Right pleural effusion. Initial encounter. EXAM: CT CHEST WITHOUT CONTRAST TECHNIQUE: Multidetector CT imaging of the chest was performed following the standard protocol without IV contrast. COMPARISON:  Portable chest 07/05/2015 and earlier. FINDINGS: Moderate size mostly layering right pleural effusion. Some fluid tracks into the right major fissure. No gas identified within the right pleural collection. Moderate to severe compressive atelectasis on the right lower lobe, plus superimposed right lower lobe consolidation with air bronchograms. No definite cavitary changes of the right lung. The upper lobe is relatively spared. No pericardial effusion. No definite mediastinal or hilar lymphadenopathy. Left side PICC line in place. Negative noncontrast thoracic inlet. No axillary lymphadenopathy.  Major airways are patent. The left lung is clear aside from mild curvilinear scarring or atelectasis at the left lung base. No upper abdominal free air or free fluid. Negative visualized noncontrast liver, gallbladder, spleen, pancreas, adrenal glands, bowel, and right renal upper pole. Lower thoracic anterior and right paraspinal phlegmon re- demonstrated with epicenter at T11-T12 (series 2, image 52). No destructive vertebral osseous changes at this time. No acute or suspicious osseous lesion identified. IMPRESSION: 1. Moderate size layering right pleural effusion with compressive atelectasis of the right lung, but superimposed right lower lobe consolidation is suspicious for right lower lobe pneumonia in this setting. 2. The left lung is spared. 3. Lower thoracic spine paraspinal phlegmon re - demonstrated at T11- T12. No destructive osseous changes at this time. Electronically Signed   By: Odessa Fleming M.D.   On: 07/07/2015 12:49   US Thoracentesis Asp Pleural Space W/img Guide  07/08/2015  INDICATION: 45 year old with a right pleural effusion and spinal osteomyelitis. EXAM: ULTRASOUND GUIDED RIGHT THORACENTESIS MEDICATIONS: None. COMPLICATIONS: None immediate. PROCEDURE: An ultrasound guided thoracentesis was thoroughly discussed with the patient and questions answered. The benefits, risks, alternatives and complications were also discussed. The patient understands and wishes to proceed with the procedure. Written consent was obtained. Ultrasound was performed to localize  and mark an adequate pocket of fluid in the right posterior chest. The area was then prepped and draped in the normal sterile fashion. 1% Lidocaine was used for local anesthesia. Under ultrasound guidance a Safe-T-Centesis catheter was introduced. No fluid could be aspirated. A Yueh catheter was then directed into the right pleural space with real-time ultrasound guidance. Again, no fluid could be aspirated. The catheter was removed and a  dressing applied. FINDINGS: Complex loculated right pleural effusion. Catheters directed into the right pleural space but no fluid could be aspirated. IMPRESSION: Unsuccessful ultrasound-guided right thoracentesis due to complex loculated right pleural effusion. Electronically Signed   By: Richarda Overlie M.D.   On: 07/08/2015 15:29     ASSESSMENT AND PLAN:   * Large right-sided pleural effusion Incidental finding on chest x-ray done for PICC line placement Ultrasound thoracentesis couldn't drain any fluid. Scheduled for pigtail catheter insertion later today. For TPA. Labs ordered Discussed case with Dr. Ardyth Man , Dr. Inez Catalina, Dr. Lowella Dandy.  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 Sample sent to the lab. Cultures no growth to date On IV vancomycin and ceftriaxone. Blood cultures no growth to date. PICC line in place Echocardiogram showed no endocarditis Afebrile now  *  Wolff-Parkinson-White syndrome is stable  * DVT prophylaxis with Lovenox  All the records are reviewed and case discussed with Care Management/Social Workerr. Management plans discussed with the patient, family and they are in agreement.  CODE STATUS: FULL CODE  DVT Prophylaxis: SCDs  TOTAL TIME TAKING CARE OF THIS PATIENT: 35 minutes.   POSSIBLE D/C IN 3-4 DEPENDING ON CLINICAL CONDITION.   Milagros Loll R M.D on 07/09/2015 at 10:06 AM  Between 7am to 6pm - Pager - [phone]  After 6pm go to [email or web address] - password [email or web address] [facility]  [facility]  Office  [phone]  CC: Primary care physician; Tommie Sams, DO  Note: This dictation was prepared with Dragon dictation along with smaller phrase technology. Any transcriptional errors that result from this process are unintentional.

## 2015-07-09 NOTE — Progress Notes (Addendum)
Received intra-pleural tPA this AM. Much improved drainage from pigtail cath now. Now with severe R pleuritic pain  Filed Vitals:   07/09/15 1240 07/09/15 1245 07/09/15 1246 07/09/15 1337  BP: 99/61 105/63 105/63 125/63  Pulse: 77 78 75 87  Temp:      TempSrc:      Resp: 25 23 26 20   Height:      Weight:      SpO2: 99% 100% 100% 96%   Appears very uncomfortable with grunting respirations HEENT WNL Crackles in R base, no wheezes Reg, no M NABS, soft No edema  BMP Latest Ref Rng 07/09/2015 07/08/2015 07/07/2015  Glucose 65 - 99 mg/dL 97 161(W142(H) 960(A108(H)  BUN 6 - 20 mg/dL 8 10 9   Creatinine 0.61 - 1.24 mg/dL 5.400.84 9.810.86 1.910.95  Sodium 135 - 145 mmol/L 135 134(L) 132(L)  Potassium 3.5 - 5.1 mmol/L 3.4(L) 2.9(LL) 2.9(LL)  Chloride 101 - 111 mmol/L 106 103 100(L)  CO2 22 - 32 mmol/L 22 24 26   Calcium 8.9 - 10.3 mg/dL 7.8(L) 7.7(L) 7.6(L)    CBC Latest Ref Rng 07/09/2015 07/07/2015 07/04/2015  WBC 3.8 - 10.6 K/uL 12.9(H) 14.4(H) 14.0(H)  Hemoglobin 13.0 - 18.0 g/dL 11.8(L) 12.6(L) 13.3  Hematocrit 40.0 - 52.0 % 35.8(L) 37.0(L) 38.9(L)  Platelets 150 - 440 K/uL 412 354 338   No pleural fluid results   CXR: improved R sided effusion  IMPRESSION: -R parapneumonic effusion -S/P indwelling pleural catheter placement -Severe R pleuritic CP - this is due to drainage of fluid and two inflamed pleural membranes now rubbing together. NSAIDS are very effective for this kind of pain  PLAN/REC: Continue catheter drainage of R pleural space Ketorolac ordered for pain There is probably little that Pulmonary medicine has to add @ this juncture. We will sign off. Please call PRN  Billy Fischeravid Toriann Spadoni, MD PCCM service Mobile [phone] Pager [phone] 07/10/2015

## 2015-07-09 NOTE — Telephone Encounter (Signed)
Filled out and told pt wife it was placed up front for pickup

## 2015-07-09 NOTE — Progress Notes (Signed)
Enoxaparin resumed based on the Post IR Procedure Consult.  Procedure considered low risk.   Enoxaparin 40 mg SQ daily restarted.   Demetrius Charityeldrin D. Emylee Decelle, PharmD

## 2015-07-09 NOTE — Procedures (Signed)
Under CT guidance, 37F drainage catheter placed into right pleural effusion, 230 mls of thick tan fluid aspirated. No immediate complication.

## 2015-07-09 NOTE — Progress Notes (Signed)
Patient ID: Eugene Robinson, male   DOB: 1971/01/18, 45 y.o.   MRN: [id number]  Chief Complaint  Patient presents with  . Flank Pain    Referred By Dr. Elpidio Anis Reason for Referral right pleural effusion  HPI Location, Quality, Duration, Severity, Timing, Context, Modifying Factors, Associated Signs and Symptoms.  Eugene Robinson is a 45 y.o. male.  I was asked to see this patient yesterday evening regarding his right pleural effusion. I have independently reviewed his chart. I discussed his care with Dr. Genella Mech and with interventional radiology regarding his x-ray findings. He is a 45 year old gentleman who again experiencing some flank pain proximate 2 weeks ago. He presented to his family physician as well as to an urgent care center where he was diagnosed with kidney stones and possible urinary tract infections. He was appropriately treated but continued to experience significant pain and ultimately presented to the emergency department where a CT scan confirmed the presence of a T11 paravertebral body mass.  Throughout this time the patient's also had some low-grade fevers. He was admitted to the hospital and a CT-guided aspirate of the paravertebral mass confirmed the presence of a purulent process however cultures have been negative to date. He has developed some shortness of breath and over the last few days several imaging studies have been performed showing an increase in a right lower lobe consolidation and a right pleural effusion. He had an attempt at an ultrasound-guided thoracentesis which was unsuccessful because of the thick nature of the fluid. I was asked to see the patient for consideration of management. Of note is that the patient is not diabetic. He does not smoke. He has no chronic pulmonary issues. 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   Past Medical History  Diagnosis Date  .  WPW (Wolff-Parkinson-White syndrome)   . Chicken pox   . GERD (gastroesophageal reflux disease)   . Kidney stones   . PVC's (premature ventricular contractions) 03/19/2015    Past Surgical History  Procedure Laterality Date  . Adenoidectomy      Family History  Problem Relation Age of Onset  . Cancer Mother     lung and breast  . Parkinson's disease Mother     Social History Social History  Substance Use Topics  . Smoking status: Never Smoker   . Smokeless tobacco: None  . Alcohol Use: No    No Known Allergies  Current Facility-Administered Medications  Medication Dose Route Frequency Provider Last Rate Last Dose  . 0.9 %  sodium chloride infusion   Intravenous Continuous Berdine Dance, MD      . 0.9 %  sodium chloride infusion   Intravenous Continuous Richarda Overlie, MD      . acetaminophen (TYLENOL) tablet 650 mg  650 mg Oral Q6H PRN Oralia Manis, MD   650 mg at 07/06/15 1947   Or  . acetaminophen (TYLENOL) suppository 650 mg  650 mg Rectal Q6H PRN Oralia Manis, MD      . cefTRIAXone (ROCEPHIN) 2 g in dextrose 5 % 50 mL IVPB  2 g Intravenous Q12H Milagros Loll, MD   2 g at 07/08/15 2109  . enoxaparin (LOVENOX) injection 40 mg  40 mg Subcutaneous Q24H Milagros Loll, MD   Stopped at 07/08/15 2100  . metoprolol (LOPRESSOR) tablet 50 mg  50 mg Oral BID Oralia Manis, MD   50 mg at 07/08/15 2110  . morphine 4  MG/ML injection 4 mg  4 mg Intravenous Q4H PRN Oralia Manis, MD   4 mg at 07/05/15 2141  . ondansetron (ZOFRAN) tablet 4 mg  4 mg Oral Q6H PRN Oralia Manis, MD   4 mg at 07/07/15 1823   Or  . ondansetron [facility]) injection 4 mg  4 mg Intravenous Q6H PRN Oralia Manis, MD      . oxyCODONE-acetaminophen (PERCOCET/ROXICET) 5-325 MG per tablet 1 tablet  1 tablet Oral Q6H PRN Oralia Manis, MD   1 tablet at 07/06/15 1137  . senna-docusate (Senokot-S) tablet 2 tablet  2 tablet Oral BID Milagros Loll, MD   2 tablet at 07/08/15 2109  . sodium chloride flush (NS) 0.9 % injection 10-40 mL   10-40 mL Intracatheter Q12H Srikar Sudini, MD   10 mL at 07/08/15 2200  . sodium chloride flush (NS) 0.9 % injection 10-40 mL  10-40 mL Intracatheter PRN Srikar Sudini, MD      . sodium chloride flush (NS) 0.9 % injection 3 mL  3 mL Intravenous Q12H Oralia Manis, MD   3 mL at 07/08/15 0944  . temazepam (RESTORIL) capsule 15 mg  15 mg Oral QHS PRN Milagros Loll, MD   15 mg at 07/08/15 2109  . vancomycin (VANCOCIN) 1,500 mg in sodium chloride 0.9 % 500 mL IVPB  1,500 mg Intravenous Q8H Srikar Sudini, MD   1,500 mg at 07/09/15 0129      Review of Systems A complete review of systems was asked and was negative except for the following positive findings Low-grade fevers, low back pain, shortness of breath,  Blood pressure 131/69, pulse 83, temperature 98.3 F (36.8 C), temperature source Oral, resp. rate 20, height  (1.778 m), weight 255 lb 6.4 oz (115.849 kg), SpO2 97 %.  Physical Exam CONSTITUTIONAL:  Pleasant, well-developed, well-nourished, and in no acute distress. EYES: Pupils equal and reactive to light, Sclera non-icteric EARS, NOSE, MOUTH AND THROAT:  The oropharynx was clear.  Dentition is good repair.  Oral mucosa pink and moist. LYMPH NODES:  Lymph nodes in the neck and axillae were normal RESPIRATORY:  Lungs were clear.  Normal respiratory effort without pathologic use of accessory muscles of respiration CARDIOVASCULAR: Heart was regular without murmurs.  There were no carotid bruits. GI: The abdomen was soft, nontender, and nondistended. There were no palpable masses. There was no hepatosplenomegaly. There were normal bowel sounds in all quadrants. GU:  Rectal deferred.   MUSCULOSKELETAL:  Normal muscle strength and tone.  No clubbing or cyanosis.   SKIN:  There were no pathologic skin lesions.  There were no nodules on palpation. NEUROLOGIC:  Sensation is normal.  Cranial nerves are grossly intact. PSYCH:  Oriented to person, place and time.  Mood and affect are  normal.  Data Reviewed Multiple imaging studies of the chest over the last 2 weeks  I have personally reviewed the patient's imaging, laboratory findings and medical records.    Assessment    I have independently reviewed the patient's CT scans chest x-rays. There is a complex right pleural effusion which be consistent with a exudative effusion perhaps sympathetic from his T11 infection. I had a long discussion with him regarding the options. I also discussed his care with interventional radiology. The patient will like to avoid any surgical intervention at that all possible. I discussed with him the option of percutaneous pigtail catheter insertion followed by intrapleural thrombolytics. I reviewed with him the indications and risks particularly the risks of bleeding. He  understands and would like to proceed with procedure.    Plan    I have discussed his care with our interventional radiologist. They are agreeable to placement of a percutaneous drain and intrapleural thrombolytics. I would be happy to manage that once the drain is positioned.       Hulda Marinimothy Dayne Chait, MD 07/09/2015, 7:57 AM

## 2015-07-09 NOTE — Progress Notes (Addendum)
Patients wife at bedside. Educated and preformed morning flush of PICC and attachment of IV antibiotics PICC line. Demonstrated good sterile and clean procedures. [facility] education and exit care education provided for daily maintenance at home. Will continue to educate wife on PICC maintenance and IV administration this shift as medications are ordered.

## 2015-07-09 NOTE — Procedures (Signed)
Procedure and risks discussed with patient. Informed consent obtained. Will perform CT-guided chest tube placement.

## 2015-07-10 DIAGNOSIS — J918 Pleural effusion in other conditions classified elsewhere: Secondary | ICD-10-CM

## 2015-07-10 DIAGNOSIS — J189 Pneumonia, unspecified organism: Secondary | ICD-10-CM | POA: Insufficient documentation

## 2015-07-10 DIAGNOSIS — R0781 Pleurodynia: Secondary | ICD-10-CM | POA: Insufficient documentation

## 2015-07-10 LAB — GLUCOSE, CAPILLARY: GLUCOSE-CAPILLARY: 89 mg/dL (ref 65–99)

## 2015-07-10 MED ORDER — KETOROLAC TROMETHAMINE 30 MG/ML IJ SOLN
30.0000 mg | Freq: Four times a day (QID) | INTRAMUSCULAR | Status: DC | PRN
  Administered 2015-07-10: 30 mg via INTRAVENOUS
  Filled 2015-07-10 (×3): qty 1

## 2015-07-10 MED ORDER — KETOROLAC TROMETHAMINE 30 MG/ML IJ SOLN
30.0000 mg | Freq: Once | INTRAMUSCULAR | Status: DC
  Filled 2015-07-10: qty 1

## 2015-07-10 NOTE — Care Management (Signed)
TPA instilled in chest tube- clamped for 5 hours. when unclamped 1 liter of fluid drained from chest cavity.  Wife is getting practice with IV administration and doing an excellent job per staff.

## 2015-07-10 NOTE — Progress Notes (Signed)
Feels somewhat better this morning with less pain and less shortness of breath despite the chest tube only draining about 150 cc.  Low grade temp earlier in the evening.  Lungs are diminished at right base Heart regular Chest tube system examined.  No air leak.  Chest tube drainage is serous and slightly yellow and cloudy. Independent review of chest xray shows large pleural effusion.  I instilled 10 mg of TPA and allowed it to dwell for 5 hours and then unclamped the tube  Immediately there was about 1 liter of serous fluid from chest cavity.  Patient complained of pleuritic chest pain at the end presumably due to lung expansion  I will repeat the chest xray later today  If the chest xray still shows a pleural effusion, then I would recommend TPA again tomorrow before recommending surgery  Eugene Robinson.

## 2015-07-10 NOTE — Progress Notes (Signed)
[facility] [place] at [facility]   PATIENT NAME: Eugene Robinson    MR#:  [id number]  DATE OF BIRTH:  05-19-1970  SUBJECTIVE:  CHIEF COMPLAINT:   Chief Complaint  Patient presents with  . Flank Pain   Admitted for back and flank pain. Started on vancomycin and Zosyn. S/p CT-guided paravertebral abscess drainage on 07/05/2015 PICC line placed on 07/05/2015. Changed to Vanc + ceftriaxone Ultrasound-guided right thoracentesis couldn't drain any fluid 07/09/2015. Right chest pigtail catheter placed 07/09/2015.  tPA injected into pigtail catheter earlier.  Right chest pain and improved Drainage REVIEW OF SYSTEMS:    Review of Systems  Constitutional: Positive for fever, chills and malaise/fatigue.  HENT: Negative for sore throat.   Eyes: Negative for blurred vision, double vision and pain.  Respiratory: Negative for cough, hemoptysis, shortness of breath and wheezing.   Cardiovascular: Negative for chest pain, palpitations, orthopnea and leg swelling.  Gastrointestinal: Positive for constipation. Negative for heartburn, nausea, vomiting, abdominal pain and diarrhea.  Genitourinary: Negative for dysuria and hematuria.  Musculoskeletal: Positive for back pain. Negative for joint pain.  Skin: Negative for rash.  Neurological: Positive for weakness. Negative for sensory change, speech change, focal weakness and headaches.  Endo/Heme/Allergies: Does not bruise/bleed easily.  Psychiatric/Behavioral: Negative for depression. The patient is not nervous/anxious.     DRUG ALLERGIES:  No Known Allergies  VITALS:  Blood pressure 119/58, pulse 77, temperature 98.1 F (36.7 C), temperature source Oral, resp. rate 18, height  (1.778 m), weight 115.849 kg (255 lb 6.4 oz), SpO2 97 %.  PHYSICAL EXAMINATION:   Physical Exam  GENERAL:  45 y.o.-year-old patient lying in the bed.  EYES: Pupils equal, round, reactive to light and accommodation. No scleral icterus.  Extraocular muscles intact.  HEENT: Head atraumatic, normocephalic. Oropharynx and nasopharynx clear.  NECK:  Supple, no jugular venous distention. No thyroid enlargement, no tenderness.  LUNGS:  Decreased breath sounds right side. Clear to auscultation CARDIOVASCULAR: S1, S2 normal. No murmurs, rubs, or gallops.  ABDOMEN: Soft, nontender, nondistended. Bowel sounds present. No organomegaly or mass.  EXTREMITIES: No cyanosis, clubbing or edema b/l.    NEUROLOGIC: Cranial nerves II through XII are intact. No focal Motor or sensory deficits b/l.   PSYCHIATRIC: The patient is alert and oriented x 3.  SKIN: No obvious rash, lesion, or ulcer.   LABORATORY PANEL:   CBC  Recent Labs Lab 07/09/15 0123  WBC 12.9*  HGB 11.8*  HCT 35.8*  PLT 412   ------------------------------------------------------------------------------------------------------------------ Chemistries   Recent Labs Lab 07/07/15 0818  07/09/15 0123  NA  --   < > 135  K  --   < > 3.4*  CL  --   < > 106  CO2  --   < > 22  GLUCOSE  --   < > 97  BUN  --   < > 8  CREATININE  --   < > 0.84  CALCIUM  --   < > 7.8*  MG 1.8  --   --   < > = values in this interval not displayed. ------------------------------------------------------------------------------------------------------------------  Cardiac Enzymes No results for input(s): TROPONINI in the last 168 hours. ------------------------------------------------------------------------------------------------------------------  RADIOLOGY:  Dg Chest 1 View  07/08/2015  CLINICAL DATA:  Post right thoracentesis. Loculated right pleural effusion and no fluid could be aspirated. EXAM: CHEST 1 VIEW COMPARISON:  Chest CT 07/07/2015 FINDINGS: Diffuse densities throughout the entire right hemithorax compatible with a complex right pleural effusion and associated  volume loss. There is no evidence for a pneumothorax. Left lung remains clear. The trachea is midline. Heart and  mediastinum are within normal limits and stable. PICC line tip in the SVC region. IMPRESSION: Persistent opacities throughout the right chest compatible with a complex right pleural effusion and associated volume loss. Negative for pneumothorax following the attempted right thoracentesis. Electronically Signed   By: Richarda Overlie M.D.   On: 07/08/2015 15:25   Dg Chest 2 View  07/09/2015  CLINICAL DATA:  Chest tube placement. EXAM: CHEST  2 VIEW COMPARISON:  Earlier film, same date. FINDINGS: Interval decrease in size of the right-sided pleural fluid collection. No pneumothorax is identified. The heart remains enlarged. The left lung is clear. IMPRESSION: Pigtail type pleural drainage catheter in place with decreased size of the right pleural fluid collection. No pneumothorax. Electronically Signed   By: Rudie Meyer M.D.   On: 07/09/2015 20:26   Dg Chest 2 View  07/09/2015  CLINICAL DATA:  Spine infection and right-sided pleural effusion EXAM: CHEST  2 VIEW COMPARISON:  07/08/2015 FINDINGS: Stable right-sided pleural effusion is noted. The cardiac shadow is stable. Left lung is clear. No acute bony abnormality is noted. IMPRESSION: Stable changes on the right. Electronically Signed   By: Alcide Clever M.D.   On: 07/09/2015 07:49   Ct Image Guided Drainage By Percutaneous Catheter  07/09/2015  INDICATION: Right pleural effusion. EXAM: CT GUIDED DRAINAGE OF right pleural empyema. MEDICATIONS: The patient is currently admitted to the hospital and receiving intravenous antibiotics. The antibiotics were administered within an appropriate time frame prior to the initiation of the procedure. ANESTHESIA/SEDATION: 3.0 mg IV Versed 75 mcg IV Fentanyl Moderate Sedation Time:  20 minutes. The patient was continuously monitored during the procedure by the interventional radiology nurse under my direct supervision. COMPLICATIONS: None immediate. TECHNIQUE: Informed written consent was obtained from the patient after a  thorough discussion of the procedural risks, benefits and alternatives. All questions were addressed. Sterile Barrier Technique was utilized including mask, sterile gloves, sterile drape, hand hygiene and skin antiseptic. A timeout was performed prior to the initiation of the procedure. PROCEDURE: The right posterior chest wall was prepped with chlorhexidine in a sterile fashion, and a sterile drape was applied covering the operative field. Sterile gloves were used for the procedure. Local anesthesia was provided with 1% Lidocaine. Under CT guidance, 18 gauge needle was directed into right pleural effusion. Guidewire was placed followup by progressively larger dilators. Eventually, 14 French drainage catheter was placed into right pleural effusion and internally and externally secured. Approximately 230 mL of thick tan fluid was aspirated and sent to the lab for further evaluation. No immediate complications were noted. FINDINGS: Approximately 230 mL of thick tan fluid was aspirated from right empyema. IMPRESSION: Under CT guidance, successful placement of 14 French drainage catheter into right pleural empyema. Electronically Signed   By: Lupita Raider, M.D.   On: 07/09/2015 13:21   US Thoracentesis Asp Pleural Space W/img Guide  07/08/2015  INDICATION: 45 year old with a right pleural effusion and spinal osteomyelitis. EXAM: ULTRASOUND GUIDED RIGHT THORACENTESIS MEDICATIONS: None. COMPLICATIONS: None immediate. PROCEDURE: An ultrasound guided thoracentesis was thoroughly discussed with the patient and questions answered. The benefits, risks, alternatives and complications were also discussed. The patient understands and wishes to proceed with the procedure. Written consent was obtained. Ultrasound was performed to localize and mark an adequate pocket of fluid in the right posterior chest. The area was then prepped and draped in the normal  sterile fashion. 1% Lidocaine was used for local anesthesia. Under  ultrasound guidance a Safe-T-Centesis catheter was introduced. No fluid could be aspirated. A Yueh catheter was then directed into the right pleural space with real-time ultrasound guidance. Again, no fluid could be aspirated. The catheter was removed and a dressing applied. FINDINGS: Complex loculated right pleural effusion. Catheters directed into the right pleural space but no fluid could be aspirated. IMPRESSION: Unsuccessful ultrasound-guided right thoracentesis due to complex loculated right pleural effusion. Electronically Signed   By: Richarda OverlieAdam  Henn M.D.   On: 07/08/2015 15:29     ASSESSMENT AND PLAN:   * Large right-sided pleural effusion Incidental finding on chest x-ray done for PICC line placement Ultrasound thoracentesis couldn't drain any fluid.  pigtail catheter inserted. Status post tPA Improved drainage but has right-sided chest pain. Added Toradol. Discussed with Dr. 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 Sample sent to the lab. Cultures no growth to date On IV vancomycin and ceftriaxone. Blood cultures no growth to date. PICC line in place Echocardiogram showed no endocarditis Afebrile now  *  Wolff-Parkinson-White syndrome is stable  * DVT prophylaxis with Lovenox  All the records are reviewed and case discussed with Care Management/Social Workerr. Management plans discussed with the patient, family and they are in agreement.  CODE STATUS: FULL CODE  DVT Prophylaxis: SCDs  TOTAL TIME TAKING CARE OF THIS PATIENT: 35 minutes.   POSSIBLE D/C IN 32-3 DEPENDING ON CLINICAL CONDITION.  Milagros LollSudini, Ether Wolters R M.D on 07/10/2015 at 1:48 PM  Between 7am to 6pm - Pager - [phone]  After 6pm go to [email or web address] - password [email or web address]  [facility]  Office  [phone]  CC: Primary care physician; Tommie SamsJayce G Cook, DO  Note: This dictation was prepared with Dragon dictation along with  smaller phrase technology. Any transcriptional errors that result from this process are unintentional.

## 2015-07-11 ENCOUNTER — Inpatient Hospital Stay: Payer: BLUE CROSS/BLUE SHIELD

## 2015-07-11 DIAGNOSIS — J869 Pyothorax without fistula: Secondary | ICD-10-CM | POA: Insufficient documentation

## 2015-07-11 LAB — CREATININE, SERUM
CREATININE: 0.88 mg/dL (ref 0.61–1.24)
GFR calc non Af Amer: >60 mL/min (ref >60)

## 2015-07-11 LAB — VANCOMYCIN, TROUGH: VANCOMYCIN TR: 24 ug/mL — AB (ref 10–20)

## 2015-07-11 MED ORDER — VANCOMYCIN HCL IN DEXTROSE 1-5 GM/200ML-% IV SOLN
1000.0000 mg | Freq: Three times a day (TID) | INTRAVENOUS | Status: DC
  Administered 2015-07-11 – 2015-07-12 (×2): 1000 mg via INTRAVENOUS
  Filled 2015-07-11 (×4): qty 200

## 2015-07-11 NOTE — Progress Notes (Signed)
ANTIBIOTIC CONSULT NOTE - follow up  Pharmacy Consult for Vancomycin  Indication: vertebral osteomyelitis  No Known Allergies  Patient Measurements: Height: 5\' 10"  (177.8 cm) Weight: 255 lb 6.4 oz (115.849 kg) IBW/kg (Calculated) : 73 Adjusted Body Weight: 90.12 kg   Vital Signs: Temp: 98.4 F (36.9 C) (03/30 1255) Temp Source: Oral (03/30 1255) BP: 117/67 mmHg (03/30 1255) Pulse Rate: 84 (03/30 1255) Intake/Output from previous day: 03/29 0701 - 03/30 0700 In: 2080 [P.O.:480; IV Piggyback:1600] Out: 1730 [Urine:300; Chest Tube:1430] Intake/Output from this shift:    Labs:  Recent Labs  07/09/15 0123 07/11/15 1711  WBC 12.9*  --   HGB 11.8*  --   PLT 412  --   CREATININE 0.84 0.88   Estimated Creatinine Clearance: 133.7 mL/min (by C-G formula based on Cr of 0.88).  Recent Labs  07/09/15 0030 07/11/15 1711  VANCOTROUGH 17 24*     Microbiology: Recent Results (from the past 720 hour(s))  Blood culture (routine x 2)     Status: None (Preliminary result)   Collection Time: 07/02/15  9:08 PM  Result Value Ref Range Status   Specimen Description BLOOD RIGHT HAND  Final   Special Requests   Final    BOTTLES DRAWN AEROBIC AND ANAEROBIC 5MLANAEROBIC, 10MLAEROBIC   Culture NO GROWTH 4 DAYS  Final   Report Status PENDING  Incomplete  Blood culture (routine x 2)     Status: None (Preliminary result)   Collection Time: 07/02/15  9:08 PM  Result Value Ref Range Status   Specimen Description BLOOD RIGHT ANTECUBITAL  Final   Special Requests BOTTLES DRAWN AEROBIC AND ANAEROBIC 5ML  Final   Culture NO GROWTH 4 DAYS  Final   Report Status PENDING  Incomplete  Urine culture     Status: None   Collection Time: 07/03/15  2:19 AM  Result Value Ref Range Status   Specimen Description URINE, CLEAN CATCH  Final   Special Requests NONE  Final   Culture NO GROWTH 1 DAY  Final   Report Status 07/04/2015 FINAL  Final  Culture, routine-abscess     Status: None   Collection  Time: 07/05/15  2:25 PM  Result Value Ref Range Status   Specimen Description ABSCESS  Final   Special Requests NONE  Final   Gram Stain   Final    MANY WBC SEEN MANY RED BLOOD CELLS NO ORGANISMS SEEN    Culture NO GROWTH 4 DAYS  Final   Report Status 07/09/2015 FINAL  Final  Culture, routine-abscess     Status: None (Preliminary result)   Collection Time: 07/09/15 12:40 PM  Result Value Ref Range Status   Specimen Description CHEST  Final   Special Requests NONE  Final   Gram Stain FEW WBC SEEN NO ORGANISMS SEEN   Final   Culture NO GROWTH 2 DAYS  Final   Report Status PENDING  Incomplete  Anaerobic culture     Status: None (Preliminary result)   Collection Time: 07/09/15 12:40 PM  Result Value Ref Range Status   Specimen Description CHEST  Final   Special Requests NONE  Final   Culture PENDING  Incomplete   Report Status PENDING  Incomplete    Medical History: Past Medical History  Diagnosis Date  . WPW (Wolff-Parkinson-White syndrome)   . Chicken pox   . GERD (gastroesophageal reflux disease)   . Kidney stones   . PVC's (premature ventricular contractions) 03/19/2015    Medications:  Prescriptions prior to admission  Medication Sig Dispense Refill Last Dose  . ciprofloxacin (CIPRO) 500 MG tablet Take 1 tablet by mouth 2 (two) times daily.   Past Month at Unknown time  . ibuprofen (ADVIL,MOTRIN) 200 MG tablet Take 200 mg by mouth every 6 (six) hours as needed.    PRN at PRN  . metoprolol (LOPRESSOR) 50 MG tablet Take 1 tablet (50 mg total) by mouth 2 (two) times daily. 60 tablet 11 07/01/2015 at Unknown time  . naproxen (NAPROSYN) 500 MG tablet Take 1 tablet by mouth 2 (two) times daily.   07/02/2015 at Unknown time  . ondansetron (ZOFRAN) 4 MG tablet Take 1 tablet (4 mg total) by mouth every 8 (eight) hours as needed for nausea or vomiting. 20 tablet 0 PRN at PRN  . oxyCODONE-acetaminophen (ROXICET) 5-325 MG tablet Take 1 tablet by mouth every 6 (six) hours as needed  for severe pain. 12 tablet 0 PRN at PRN  . valACYclovir (VALTREX) 1000 MG tablet Take 1 tablet (1,000 mg total) by mouth 3 (three) times daily. 21 tablet 0 07/01/2015 at Unknown time   Assessment: Pharmacy consulted to dose vancomycin in this 45 year old male with vertebral osteomyelitis.  3/28 Trough= 17 mcg/ml 3/30 Trougn= 24 mcg/ml  Goal of Therapy:  Vancomycin trough level 15-20 mcg/ml  Plan:  Vancomycin trough is below goal. Will increase vancomycin to 1500 mg iv q 8 hours and check another trough with the 5th dose of this new regimen.   3/30  Vancomycin trough= 24 mcg/ml.  Will adjust dose to 1 gram IV Q8h. Will check next trough prior to 4th dose of new regimen on 3/31 at 2130.  Iaan Oregel A 07/11/2015,7:07 PM

## 2015-07-11 NOTE — Progress Notes (Signed)
[facility] [place] [facility]   PATIENT NAME: Eugene Robinson    MR#:  [id number]  DATE OF BIRTH:  09-May-1970  SUBJECTIVE:  CHIEF COMPLAINT:   Chief Complaint  Patient presents with  . Flank Pain   Admitted for back and flank pain. Started on vancomycin and Zosyn. S/p CT-guided paravertebral abscess drainage on 07/05/2015 PICC line placed on 07/05/2015. Changed to Vanc + ceftriaxone Ultrasound-guided right thoracentesis couldn't drain any fluid 07/09/2015.  Back pain is resolved Chest tightness improved  Right chest pigtail catheter in place. Draining well after TPA. REVIEW OF SYSTEMS:    Review of Systems  Constitutional: Positive for fever, chills and malaise/fatigue.  HENT: Negative for sore throat.   Eyes: Negative for blurred vision, double vision and pain.  Respiratory: Negative for cough, hemoptysis, shortness of breath and wheezing.   Cardiovascular: Negative for chest pain, palpitations, orthopnea and leg swelling.  Gastrointestinal: Positive for constipation. Negative for heartburn, nausea, vomiting, abdominal pain and diarrhea.  Genitourinary: Negative for dysuria and hematuria.  Musculoskeletal: Positive for back pain. Negative for joint pain.  Skin: Negative for rash.  Neurological: Positive for weakness. Negative for sensory change, speech change, focal weakness and headaches.  Endo/Heme/Allergies: Does not bruise/bleed easily.  Psychiatric/Behavioral: Negative for depression. The patient is not nervous/anxious.     DRUG ALLERGIES:  No Known Allergies  VITALS:  Blood pressure 107/56, pulse 84, temperature 98.2 F (36.8 C), temperature source Oral, resp. rate 18, height 5\' 10"  (1.778 m), weight 115.849 kg (255 lb 6.4 oz), SpO2 97 %.  PHYSICAL EXAMINATION:   Physical Exam  GENERAL:  45 y.o.-year-old patient lying in the bed.  EYES: Pupils equal, round, reactive to light and accommodation. No scleral icterus. Extraocular muscles  intact.  HEENT: Head atraumatic, normocephalic. Oropharynx and nasopharynx clear.  NECK:  Supple, no jugular venous distention. No thyroid enlargement, no tenderness.  LUNGS:  Decreased breath sounds right side, improved. Clear to auscultation CARDIOVASCULAR: S1, S2 normal. No murmurs, rubs, or gallops.  ABDOMEN: Soft, nontender, nondistended. Bowel sounds present. No organomegaly or mass.  EXTREMITIES: No cyanosis, clubbing or edema b/l.    NEUROLOGIC: Cranial nerves II through XII are intact. No focal Motor or sensory deficits b/l.   PSYCHIATRIC: The patient is alert and oriented x 3.  SKIN: No obvious rash, lesion, or ulcer.  Right chest pigtail catheter in place  LABORATORY PANEL:   CBC  Recent Labs Lab 07/09/15 0123  WBC 12.9*  HGB 11.8*  HCT 35.8*  PLT 412   ------------------------------------------------------------------------------------------------------------------ Chemistries   Recent Labs Lab 07/07/15 0818  07/09/15 0123  NA  --   < > 135  K  --   < > 3.4*  CL  --   < > 106  CO2  --   < > 22  GLUCOSE  --   < > 97  BUN  --   < > 8  CREATININE  --   < > 0.84  CALCIUM  --   < > 7.8*  MG 1.8  --   --   < > = values in this interval not displayed. ------------------------------------------------------------------------------------------------------------------  Cardiac Enzymes No results for input(s): TROPONINI in the last 168 hours. ------------------------------------------------------------------------------------------------------------------  RADIOLOGY:  Dg Chest 2 View  07/11/2015  CLINICAL DATA:  Empyema.  Chest tube. EXAM: CHEST  2 VIEW COMPARISON:  07/09/2015 FINDINGS: Right chest tube remains in place and has been retracted slightly in the interval. Left PICC remains in place, terminating  over the mid to lower SVC. Cardiomediastinal silhouette is within normal limits. Right-sided pleural fluid collection has mildly decreased in size since the prior  study. The patchy opacities are present in the right lung, greatest in the base. There is minimal left basilar atelectasis or scarring. No pneumothorax is identified. IMPRESSION: 1. Mildly decreased size of right pleural fluid collection. No pneumothorax identified. 2. Patchy right lung infiltrates suspicious for pneumonia. Electronically Signed   By: Sebastian Ache M.D.   On: 07/11/2015 08:25   Dg Chest 2 View  07/09/2015  CLINICAL DATA:  Chest tube placement. EXAM: CHEST  2 VIEW COMPARISON:  Earlier film, same date. FINDINGS: Interval decrease in size of the right-sided pleural fluid collection. No pneumothorax is identified. The heart remains enlarged. The left lung is clear. IMPRESSION: Pigtail type pleural drainage catheter in place with decreased size of the right pleural fluid collection. No pneumothorax. Electronically Signed   By: Rudie Meyer M.D.   On: 07/09/2015 20:26   Ct Image Guided Drainage By Percutaneous Catheter  07/09/2015  INDICATION: Right pleural effusion. EXAM: CT GUIDED DRAINAGE OF right pleural empyema. MEDICATIONS: The patient is currently admitted to the hospital and receiving intravenous antibiotics. The antibiotics were administered within an appropriate time frame prior to the initiation of the procedure. ANESTHESIA/SEDATION: 3.0 mg IV Versed 75 mcg IV Fentanyl Moderate Sedation Time:  20 minutes. The patient was continuously monitored during the procedure by the interventional radiology nurse under my direct supervision. COMPLICATIONS: None immediate. TECHNIQUE: Informed written consent was obtained from the patient after a thorough discussion of the procedural risks, benefits and alternatives. All questions were addressed. Sterile Barrier Technique was utilized including mask, sterile gloves, sterile drape, hand hygiene and skin antiseptic. A timeout was performed prior to the initiation of the procedure. PROCEDURE: The right posterior chest wall was prepped with chlorhexidine  in a sterile fashion, and a sterile drape was applied covering the operative field. Sterile gloves were used for the procedure. Local anesthesia was provided with 1% Lidocaine. Under CT guidance, 18 gauge needle was directed into right pleural effusion. Guidewire was placed followup by progressively larger dilators. Eventually, 14 French drainage catheter was placed into right pleural effusion and internally and externally secured. Approximately 230 mL of thick tan fluid was aspirated and sent to the lab for further evaluation. No immediate complications were noted. FINDINGS: Approximately 230 mL of thick tan fluid was aspirated from right empyema. IMPRESSION: Under CT guidance, successful placement of 14 French drainage catheter into right pleural empyema. Electronically Signed   By: Lupita Raider, M.D.   On: 07/09/2015 13:21     ASSESSMENT AND PLAN:   * Large right-sided pleural effusion - Improving Incidental finding on chest x-ray done for PICC line placement Ultrasound thoracentesis couldn't drain any fluid.  Right chest Pigtail catheter placed. Draining well after tPA. Repeat chest x-rays improved. Dr. Thelma Barge on board.  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 Sample sent to the lab. Cultures no growth to date On IV vancomycin and ceftriaxone. Blood cultures no growth to date. PICC line in place Echocardiogram showed no endocarditis Afebrile now  *  Wolff-Parkinson-White syndrome is stable  * DVT prophylaxis with Lovenox  All the records are reviewed and case discussed with Care Management/Social Workerr. Management plans discussed with the patient, family and they are in agreement.  CODE STATUS: FULL CODE  DVT Prophylaxis: SCDs  TOTAL TIME TAKING CARE OF THIS PATIENT: 35 minutes.  POSSIBLE D/C IN 3-4 DEPENDING ON CLINICAL CONDITION.   Milagros Loll R M.D on 07/11/2015 at 12:44 PM  Between 7am to 6pm - Pager -  [phone]  After 6pm go to [email or web address] - password [email or web address] [facility]  [facility]  Office  [phone]  CC: Primary care physician; Tommie Sams, DO  Note: This dictation was prepared with Dragon dictation along with smaller phrase technology. Any transcriptional errors that result from this process are unintentional.

## 2015-07-11 NOTE — Progress Notes (Signed)
[facility] Patient is in agreement with plan of care. No complaints of pain. Patient transferred to 204 via bed escorted by staff and nurse tech.

## 2015-07-11 NOTE — Progress Notes (Signed)
He states he feels much better. He did have some discomfort with the pleural drainage but denied any fevers or shortness of breath.  He remains afebrile. His vital signs are stable. His chest tube system was interrogated. There is no air leak. There is no erythema or discharge around the tube. His lungs are clear. There are perhaps slightly diminished bilaterally. His heart regular.  I have independently reviewed the chest x-ray from today. I discussed his care with Dr. Irish LackGlenn Yamagata in interventional radiology. The chest x-rays interpreted as showing dramatic improvement. 1500 cc were drained from the pleural space after TPA administration yesterday.  We will obtain another CT scan the chest today. I do not believe that he will require any further intervention or chest tube insertion. I did try to milk the chest tubes today and could obtain no further fluid.  Eugene Robinson

## 2015-07-12 ENCOUNTER — Encounter: Payer: Self-pay | Admitting: Family Medicine

## 2015-07-12 DIAGNOSIS — M869 Osteomyelitis, unspecified: Secondary | ICD-10-CM | POA: Diagnosis not present

## 2015-07-12 LAB — CBC
HEMATOCRIT: 35.5 % — AB (ref 40.0–52.0)
HEMOGLOBIN: 12.1 g/dL — AB (ref 13.0–18.0)
MCH: 29 pg (ref 26.0–34.0)
MCHC: 34.1 g/dL (ref 32.0–36.0)
MCV: 85.2 fL (ref 80.0–100.0)
Platelets: 430 10*3/uL (ref 150–440)
RBC: 4.16 MIL/uL — AB (ref 4.40–5.90)
RDW: 13.6 % (ref 11.5–14.5)
WBC: 8.1 10*3/uL (ref 3.8–10.6)

## 2015-07-12 LAB — POTASSIUM: Potassium: 3.1 mmol/L — ABNORMAL LOW (ref 3.5–5.1)

## 2015-07-12 LAB — CULTURE, ROUTINE-ABSCESS: CULTURE: NO GROWTH

## 2015-07-12 NOTE — Discharge Instructions (Addendum)
°  DIET:  Cardiac diet  DISCHARGE CONDITION:  Stable  ACTIVITY:  Activity as tolerated  OXYGEN:  Home Oxygen: No.   Oxygen Delivery: room air  DISCHARGE LOCATION:  home   If you experience worsening of your admission symptoms, develop shortness of breath, life threatening emergency, suicidal or homicidal thoughts you must seek medical attention immediately by calling 911 or calling your MD immediately  if symptoms less severe.  You Must read complete instructions/literature along with all the possible adverse reactions/side effects for all the Medicines you take and that have been prescribed to you. Take any new Medicines after you have completely understood and accpet all the possible adverse reactions/side effects.   Please note  You were cared for by a hospitalist during your hospital stay. If you have any questions about your discharge medications or the care you received while you were in the hospital after you are discharged, you can call the unit and asked to speak with the hospitalist on call if the hospitalist that took care of you is not available. Once you are discharged, your primary care physician will handle any further medical issues. Please note that NO REFILLS for any discharge medications will be authorized once you are discharged, as it is imperative that you return to your primary care physician (or establish a relationship with a primary care physician if you do not have one) for your aftercare needs so that they can reassess your need for medications and monitor your lab values.   Discharge antibiotics Vancomycin 1500 mg every 12 hours .  Goal vancomycin trough 15-20.  Pharmacy to adjust dosing based on levels Ceftriaxone2grams every 12 hours  Till 08/16/2015

## 2015-07-12 NOTE — Discharge Summary (Signed)
[facility] [place] at [facility]   PATIENT NAME: Eugene Robinson    MR#:  [id number]  DATE OF BIRTH:  11-12-1970  DATE OF ADMISSION:  07/02/2015 ADMITTING PHYSICIAN: Oralia Manis, MD  DATE OF DISCHARGE: 07/12/2015  PRIMARY CARE PHYSICIAN: Tommie Sams, DO   ADMISSION DIAGNOSIS:  Shingles [B02.9] Intra-abdominal infection [B99.9]  DISCHARGE DIAGNOSIS:  Principal Problem:   CAP (community acquired pneumonia) Active Problems:   WPW (Wolff-Parkinson-White syndrome)   Back pain   GERD (gastroesophageal reflux disease)   Pleural effusion on right   Parapneumonic effusion   Pleurodynia   Empyema (HCC)   SECONDARY DIAGNOSIS:   Past Medical History  Diagnosis Date  . WPW (Wolff-Parkinson-White syndrome)   . Chicken pox   . GERD (gastroesophageal reflux disease)   . Kidney stones   . PVC's (premature ventricular contractions) 03/19/2015     ADMITTING HISTORY  Eugene Robinson is a 45 y.o. male who presents with back pain. Patient states that this pain started 4-5 days ago. He came to the ED for evaluation and had a CT abdomen that time that was read as within normal limits. He then went to an urgent care as the pain persisted, and it was felt that he might have shingles. He then went to his primary care physician felt that there is something more going on, and sent him for repeat CT abdomen, which the patient had today. Repeat imaging shows what is likely basilar pneumonia and also an area of retrocrucal inflammation. This may be due to seeding from what is possibly his pulmonary infection, or some other inflammatory process. Differential speculated possibly aortitis or discitis. Patient was given IV antibiotics and hospitalists were called for admission.  HOSPITAL COURSE:    * Large right-sided pleural effusion/ empyema- Improving Incidental finding on chest x-ray done for PICC line placement Ultrasound thoracentesis couldn't drain any fluid.  Right chest  Pigtail catheter placed. Draining well after tPA. Repeat chest x-rays improved. Dr. Thelma Barge on board. And will follow as outpatient. 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 Sample sent to the lab. Cultures no growth to date On IV vancomycin and ceftriaxone through PICC line at home until 08/16/2015. Blood cultures no growth to date. PICC line in place Echocardiogram showed no endocarditis Afebrile now  * Wolff-Parkinson-White syndrome is stable  * DVT prophylaxis with Lovenox during hospitalization  Stable for discharge home to follow-up with infectious disease, thoracic surgery and primary care physician.  CONSULTS OBTAINED:  Treatment Team:  Clydie Braun, MD  DRUG ALLERGIES:  No Known Allergies  DISCHARGE MEDICATIONS:   Current Discharge Medication List    CONTINUE these medications which have NOT CHANGED   Details  ibuprofen (ADVIL,MOTRIN) 200 MG tablet Take 200 mg by mouth every 6 (six) hours as needed.     metoprolol (LOPRESSOR) 50 MG tablet Take 1 tablet (50 mg total) by mouth 2 (two) times daily. Qty: 60 tablet, Refills: 11    naproxen (NAPROSYN) 500 MG tablet Take 1 tablet by mouth 2 (two) times daily.    ondansetron (ZOFRAN) 4 MG tablet Take 1 tablet (4 mg total) by mouth every 8 (eight) hours as needed for nausea or vomiting. Qty: 20 tablet, Refills: 0    oxyCODONE-acetaminophen (ROXICET) 5-325 MG tablet Take 1 tablet by mouth every 6 (six) hours as needed for severe pain. Qty: 12 tablet, Refills: 0  STOP taking these medications     ciprofloxacin (CIPRO) 500 MG tablet      valACYclovir (VALTREX) 1000 MG tablet        Discharge antibiotics Vancomycin 1500 mg every 12 hours .  Goal vancomycin trough 15-20.  Pharmacy to adjust dosing based on levels Ceftriaxone2grams  every 12 hours  Till 08/16/2015  Today   VITAL SIGNS:  Blood pressure 110/57, pulse 78, temperature 97.9 F (36.6 C), temperature source Oral, resp. rate 16, height  (1.778 m), weight 115.849 kg (255 lb 6.4 oz), SpO2 98 %.  I/O:   Intake/Output Summary (Last 24 hours) at 07/12/15 1357 Last data filed at 07/12/15 1100  Gross per 24 hour  Intake    530 ml  Output   1675 ml  Net  -1145 ml    PHYSICAL EXAMINATION:  Physical Exam  GENERAL:  45 y.o.-year-old patient lying in the bed with no acute distress.  LUNGS: Normal breath sounds bilaterally, no wheezing, rales,rhonchi or crepitation. No use of accessory muscles of respiration. Right chest pigtail catheter CARDIOVASCULAR: S1, S2 normal. No murmurs, rubs, or gallops.  ABDOMEN: Soft, non-tender, non-distended. Bowel sounds present. No organomegaly or mass.  NEUROLOGIC: Moves all 4 extremities. PSYCHIATRIC: The patient is alert and oriented x 3.  SKIN: No obvious rash, lesion, or ulcer.  PICC line in place  DATA REVIEW:   CBC  Recent Labs Lab 07/12/15 0455  WBC 8.1  HGB 12.1*  HCT 35.5*  PLT 430    Chemistries   Recent Labs Lab 07/07/15 0818  07/09/15 0123 07/11/15 1711 07/12/15 0832  NA  --   < > 135  --   --   K  --   < > 3.4*  --  3.1*  CL  --   < > 106  --   --   CO2  --   < > 22  --   --   GLUCOSE  --   < > 97  --   --   BUN  --   < > 8  --   --   CREATININE  --   < > 0.84 0.88  --   CALCIUM  --   < > 7.8*  --   --   MG 1.8  --   --   --   --   < > = values in this interval not displayed.  Cardiac Enzymes No results for input(s): TROPONINI in the last 168 hours.  Microbiology Results  Results for orders placed or performed during the hospital encounter of 07/02/15  Blood culture (routine x 2)     Status: None (Preliminary result)   Collection Time: 07/02/15  9:08 PM  Result Value Ref Range Status   Specimen Description BLOOD RIGHT HAND  Final   Special Requests    Final    BOTTLES DRAWN AEROBIC AND ANAEROBIC ,   Culture NO GROWTH 4 DAYS  Final   Report Status PENDING  Incomplete  Blood culture (routine x 2)     Status: None (Preliminary result)   Collection Time: 07/02/15  9:08 PM  Result Value Ref Range Status   Specimen Description BLOOD RIGHT ANTECUBITAL  Final   Special Requests BOTTLES DRAWN AEROBIC AND ANAEROBIC  Final   Culture NO GROWTH 4 DAYS  Final   Report Status PENDING  Incomplete  Urine culture     Status: None   Collection Time: 07/03/15  2:19 AM  Result Value  Ref Range Status   Specimen Description URINE, CLEAN CATCH  Final   Special Requests NONE  Final   Culture NO GROWTH 1 DAY  Final   Report Status 07/04/2015 FINAL  Final  Culture, routine-abscess     Status: None   Collection Time: 07/05/15  2:25 PM  Result Value Ref Range Status   Specimen Description ABSCESS  Final   Special Requests NONE  Final   Gram Stain   Final    MANY WBC SEEN MANY RED BLOOD CELLS NO ORGANISMS SEEN    Culture NO GROWTH 4 DAYS  Final   Report Status 07/09/2015 FINAL  Final  Culture, routine-abscess     Status: None   Collection Time: 07/09/15 12:40 PM  Result Value Ref Range Status   Specimen Description CHEST  Final   Special Requests NONE  Final   Gram Stain FEW WBC SEEN NO ORGANISMS SEEN   Final   Culture NO GROWTH 3 DAYS  Final   Report Status 07/12/2015 FINAL  Final  Anaerobic culture     Status: None (Preliminary result)   Collection Time: 07/09/15 12:40 PM  Result Value Ref Range Status   Specimen Description CHEST  Final   Special Requests NONE  Final   Culture PENDING  Incomplete   Report Status PENDING  Incomplete    RADIOLOGY:  Dg Chest 2 View  07/11/2015  CLINICAL DATA:  Empyema.  Chest tube. EXAM: CHEST  2 VIEW COMPARISON:  07/09/2015 FINDINGS: Right chest tube remains in place and has been retracted slightly in the interval. Left PICC remains in place, terminating over the mid to lower SVC.  Cardiomediastinal silhouette is within normal limits. Right-sided pleural fluid collection has mildly decreased in size since the prior study. The patchy opacities are present in the right lung, greatest in the base. There is minimal left basilar atelectasis or scarring. No pneumothorax is identified. IMPRESSION: 1. Mildly decreased size of right pleural fluid collection. No pneumothorax identified. 2. Patchy right lung infiltrates suspicious for pneumonia. Electronically Signed   By: Sebastian AcheAllen  Grady M.D.   On: 07/11/2015 08:25   Ct Chest Wo Contrast  07/11/2015  CLINICAL DATA:  Followup right pleural effusion. EXAM: CT CHEST WITHOUT CONTRAST TECHNIQUE: Multidetector CT imaging of the chest was performed following the standard protocol without IV contrast. COMPARISON:  07/07/2015. FINDINGS: Mediastinum: Normal heart size. There is no pericardial effusion. The trachea appears patent and is midline. Normal appearance of the esophagus. No mediastinal adenopathy. There is a right hilar node which measures 11 mm, image 31 of series 2 previously 9 mm. Lungs/Pleura: There is a percutaneous scratch set there is a right-sided percutaneous chest tube in place. The chest tube appears to be in appropriate position and there has been decrease in volume of the partially loculated right effusion. A small amount of gas is identified within the pleural space overlying the right upper lobe. Subsegmental atelectasis within the right middle lobe and right lower lobe noted. Upper Abdomen: The adrenal glands are normal. Unremarkable appearance of the liver and spleen. Visualized portions of the adrenal gland scratched the visualized portions of the pancreas are negative. Musculoskeletal: No aggressive lytic or sclerotic bone lesions identified. Mild spondylosis is noted within the thoracic spine. IMPRESSION: 1. Decrease in volume of right pleural effusion status post percutaneous pigtail chest tube placement. No complications identified.  Electronically Signed   By: Signa Kellaylor  Stroud M.D.   On: 07/11/2015 15:00    Follow up with PCP in 1  week.  Management plans discussed with the patient, family and they are in agreement.  CODE STATUS:     Code Status Orders        Start     Ordered   07/09/15 1302  Full code   Continuous     07/09/15 1301    Code Status History    Date Active Date Inactive Code Status Order ID Comments User Context   07/03/2015  1:49 AM 07/09/2015  1:01 PM Full Code [id number]  Oralia Manis, MD Inpatient      TOTAL TIME TAKING CARE OF THIS PATIENT ON DAY OF DISCHARGE: more than 30 minutes.   Milagros Loll R M.D on 07/12/2015 at 1:57 PM  Between 7am to 6pm - Pager - [phone]  After 6pm go to [email or web address] - password [email or web address] [facility]  [facility]  Office  [phone]  CC: Primary care physician; Tommie Sams, DO  Note: This dictation was prepared with Dragon dictation along with smaller phrase technology. Any transcriptional errors that result from this process are unintentional.

## 2015-07-12 NOTE — Progress Notes (Signed)
Pt to be discharged per MD order. PICC line in place and pt will be discharged with long term abx. HH needs set up per [facility] with CM. Instructions reviewed with pt and all questions answered.

## 2015-07-12 NOTE — Care Management (Signed)
Patient has been discharged.  Patient to discharge on IV Vanc and rocephin.  I have spoken to [place] with [facility].  Medication to be delivered to home at 6:00 Pm.  I have notified Elnita MaxwellCheryl with [facility] of discharge.  Nursing to arrive at 7:00 pm to administer first q12h dosing. RNCM signing off

## 2015-07-12 NOTE — Care Management (Signed)
Patient transferred from 2A.  Confirmed with patient and wife that discharge plan is for patient to go home on IV antibiotics.  Patient has selected [facility] home [facility] for nursing, and [facility] for medications.

## 2015-07-12 NOTE — Progress Notes (Signed)
Eugene Robinson Inpatient Post-Op Note  Patient ID: Eugene Robinson, male   DOB: 08/12/1970, 45 y.o.   MRN: [id number]  HISTORY: Did very well overnight.  No fever.  No shortness of breath.  Pain under very good control.  Remains on antibiotics.   Filed Vitals:   07/12/15 0511 07/12/15 1153  BP: 115/64 110/57  Pulse: 72 78  Temp: 98.2 F (36.8 C) 97.9 F (36.6 C)  Resp: 17 16     EXAM: Resp: Lungs are clear bilaterally.  No respiratory distress, normal effort. Heart:  Regular without murmurs Abd:  Abdomen is soft, non distended and non tender. No masses are palpable.  There is no rebound and no guarding.  Neurological: Alert and oriented to person, place, and time. Coordination normal.  Skin: Skin is warm and dry. No rash noted. No diaphoretic. No erythema. No pallor.  Psychiatric: Normal mood and affect. Normal behavior. Judgment and thought content normal.    ASSESSMENT: Independent review of CT scan shows marked improvement with several small fluid collections that are not easily drained percutaneously.  I replaced the atrium with a Heimlich valve and Foley bag and changed the dressings.  I instructed the wife on wound care.     PLAN:   May be discharged from my standpoint.  Will need followup with me in one week with a chest xray.  I will manage chest tube as outpatient.  Will need prolonged antibiotic coverage for his paraspinal fluid and empyema (note that all cultures are negative)      Hulda Marinimothy Ibtisam Benge, MD

## 2015-07-13 LAB — ANAEROBIC CULTURE

## 2015-07-16 DIAGNOSIS — Z452 Encounter for adjustment and management of vascular access device: Secondary | ICD-10-CM | POA: Diagnosis not present

## 2015-07-16 DIAGNOSIS — I456 Pre-excitation syndrome: Secondary | ICD-10-CM | POA: Diagnosis not present

## 2015-07-16 DIAGNOSIS — J90 Pleural effusion, not elsewhere classified: Secondary | ICD-10-CM | POA: Diagnosis not present

## 2015-07-16 DIAGNOSIS — M4624 Osteomyelitis of vertebra, thoracic region: Secondary | ICD-10-CM | POA: Diagnosis not present

## 2015-07-17 ENCOUNTER — Ambulatory Visit (INDEPENDENT_AMBULATORY_CARE_PROVIDER_SITE_OTHER): Payer: BLUE CROSS/BLUE SHIELD | Admitting: Family Medicine

## 2015-07-17 VITALS — BP 116/82 | HR 79 | Temp 97.7°F | Ht 70.0 in | Wt 238.2 lb

## 2015-07-17 DIAGNOSIS — J869 Pyothorax without fistula: Secondary | ICD-10-CM

## 2015-07-17 DIAGNOSIS — J189 Pneumonia, unspecified organism: Secondary | ICD-10-CM

## 2015-07-17 DIAGNOSIS — M4620 Osteomyelitis of vertebra, site unspecified: Secondary | ICD-10-CM

## 2015-07-17 DIAGNOSIS — J948 Other specified pleural conditions: Secondary | ICD-10-CM | POA: Insufficient documentation

## 2015-07-17 NOTE — Assessment & Plan Note (Signed)
New problem. Doing well on IV antibiotics.

## 2015-07-17 NOTE — Progress Notes (Signed)
Pre visit review using our clinic review tool, if applicable. No additional management support is needed unless otherwise documented below in the visit note. 

## 2015-07-17 NOTE — Patient Instructions (Signed)
Continue the medications and antibiotics.  Follow up closely with Dr. Thelma Bargeaks and Dr. Sampson GoonFitzgerald.  Call [place] to make sure the Troughs are getting done.  Take care  Follow up in 6 weeks.  Dr. Adriana Simasook

## 2015-07-17 NOTE — Assessment & Plan Note (Signed)
New problem. Doing well s/p hospitalization. Still on IV Vanc and Rocephin.  Unclear who is obtaining/managing Vanc trough. I encouraged the wife to call his ID physician Dr. Sampson GoonFitzgerald to discuss. He has follow-up with infectious disease approximately week. Patient to continue IV antibiotics; planned 6-8 week course.

## 2015-07-17 NOTE — Progress Notes (Signed)
Subjective:  Patient ID: Eugene Robinson, male    DOB: [birth year]  Age: 45 y.o. MRN: [id number]  CC: Hospital follow up  HPI:  45 year old male with a PMH of WPW presents for hospital follow up.  Hospital course was reviewed and is summarized as below: Patient was admitted on 3/21. He saw me on 3/21 with complaints of back/flank pain as well as abdominal pain. he been seen previously in the ED as well as urgent care 2. He had a CT of his abdomen which was unremarkable. He saw me for worsening pain. I repeated the CT scan which revealed bilateral pleural effusion and infiltrate as well as retrocrural inflammation. He was subsequently sent to the hospital for IV antibiotics.  During hospitalization, MRI was obtained and T 11/T12 vertebral osteomyelitis with abscess. CT-guided drainage was done and cultures were negative. He was placed on IV antibiotics (Vanc, Rocephin).  Additionally, patient found to have pneumonia and subsequently developed a large right-sided pleural effusion/empyema. Chest tube was placed. There was minimal drainage given extensive, loculated effusion. tPA was used and it subsequently drained well.   Patient was discharged home on IV Vanc and Rocephin via PICC.  Patient presents today for follow-up. He states that he is doing much better at this time. He has minimal pain related to the chest tube. His wife has been dressing/cleaning around the chest tube. No reported redness. He had no further fever or chills. Appetite improving. He's still weak. Voiding well, regular BM's. No complaints at this time.  Social Hx   Social History   Social History  . Marital Status: Married    Spouse Name: N/A  . Number of Children: N/A  . Years of Education: N/A   Social History Main Topics  . Smoking status: Never Smoker   . Smokeless tobacco: Not on file  . Alcohol Use: No  . Drug Use: No  . Sexual Activity: Not on file   Other Topics Concern  . Not on file   Social History  Narrative   Review of Systems  Constitutional: Negative for fever.  Respiratory: Negative for shortness of breath.   Musculoskeletal:       Mild back pain.   Objective:  BP 116/82 mmHg  Pulse 79  Temp(Src) 97.7 F (36.5 C) (Oral)  Ht  (1.778 m)  Wt 238 lb 4 oz (108.069 kg)  BMI 34.19 kg/m2  SpO2 97%  BP/Weight 07/17/2015 07/12/2015 07/03/2015  Systolic BP 116 110 -  Diastolic BP 82 57 -  Wt. (Lbs) 238.25 - 255.4  BMI 34.19 - -   Physical Exam  Constitutional: He is oriented to person, place, and time. He appears well-developed. No distress.  HENT:  Head: Normocephalic and atraumatic.  Cardiovascular: Normal rate and regular rhythm.   Pulmonary/Chest: Effort normal and breath sounds normal. He has no wheezes. He has no rales.  Chest tube in place. Site without erythema.  Neurological: He is alert and oriented to person, place, and time.  Psychiatric:  Flat affect.   Vitals reviewed.  Lab Results  Component Value Date   WBC 8.1 07/12/2015   HGB 12.1* 07/12/2015   HCT 35.5* 07/12/2015   PLT 430 07/12/2015   GLUCOSE 97 07/09/2015   CHOL 129 11/14/2014   TRIG 109.0 11/14/2014   HDL 33.50* 11/14/2014   LDLCALC 74 11/14/2014   ALT 32 07/02/2015   AST 29 07/02/2015   NA 135 07/09/2015   K 3.1* 07/12/2015   CL  106 07/09/2015   CREATININE 0.88 07/11/2015   BUN 8 07/09/2015   CO2 22 07/09/2015   INR 1.31 07/04/2015   HGBA1C 5.4 11/14/2014    Assessment & Plan:   Problem List Items Addressed This Visit    CAP (community acquired pneumonia)    New problem. Doing well on IV antibiotics.       Empyema (HCC)    New problem. S/p chest tube. Minimal drainage from chest tube today; serosanguinous. Has follow up with Gen Surg tomorrow.  Will likely have tube removed at that time.       Vertebral osteomyelitis (HCC) - Primary    New problem. Doing well s/p hospitalization. Still on IV Vanc and Rocephin.  Unclear who is obtaining/managing Vanc trough. I  encouraged the wife to call his ID physician Dr. Sampson GoonFitzgerald to discuss. He has follow-up with infectious disease approximately week. Patient to continue IV antibiotics; planned 6-8 week course.         Follow-up: 6 weeks.  Everlene OtherJayce Dorla Guizar DO [facility] [place]

## 2015-07-17 NOTE — Assessment & Plan Note (Signed)
New problem. S/p chest tube. Minimal drainage from chest tube today; serosanguinous. Has follow up with Gen Surg tomorrow.  Will likely have tube removed at that time.

## 2015-07-18 ENCOUNTER — Encounter: Payer: Self-pay | Admitting: Family Medicine

## 2015-07-18 ENCOUNTER — Inpatient Hospital Stay: Payer: BLUE CROSS/BLUE SHIELD | Attending: Cardiothoracic Surgery | Admitting: Cardiothoracic Surgery

## 2015-07-18 ENCOUNTER — Ambulatory Visit
Admission: RE | Admit: 2015-07-18 | Discharge: 2015-07-18 | Disposition: A | Discharge status: Home / Self Care | Payer: BLUE CROSS/BLUE SHIELD | Source: Ambulatory Visit | Attending: Cardiothoracic Surgery | Admitting: Cardiothoracic Surgery

## 2015-07-18 VITALS — BP 109/71 | HR 76 | Temp 98.1°F | Ht 70.0 in | Wt 241.0 lb

## 2015-07-18 DIAGNOSIS — R7982 Elevated C-reactive protein (CRP): Secondary | ICD-10-CM | POA: Diagnosis not present

## 2015-07-18 DIAGNOSIS — J869 Pyothorax without fistula: Secondary | ICD-10-CM

## 2015-07-18 DIAGNOSIS — D649 Anemia, unspecified: Secondary | ICD-10-CM | POA: Diagnosis not present

## 2015-07-18 DIAGNOSIS — J9811 Atelectasis: Secondary | ICD-10-CM | POA: Insufficient documentation

## 2015-07-18 DIAGNOSIS — J90 Pleural effusion, not elsewhere classified: Secondary | ICD-10-CM | POA: Diagnosis not present

## 2015-07-18 DIAGNOSIS — M4624 Osteomyelitis of vertebra, thoracic region: Secondary | ICD-10-CM | POA: Diagnosis not present

## 2015-07-18 DIAGNOSIS — I456 Pre-excitation syndrome: Secondary | ICD-10-CM | POA: Diagnosis not present

## 2015-07-18 DIAGNOSIS — Z452 Encounter for adjustment and management of vascular access device: Secondary | ICD-10-CM | POA: Diagnosis not present

## 2015-07-18 LAB — HEPATIC FUNCTION PANEL
ALT: 40 U/L (ref 10–40)
AST: 28 U/L (ref 14–40)

## 2015-07-18 LAB — CBC AND DIFFERENTIAL
HEMATOCRIT: 41 % (ref 41–53)
Hemoglobin: 13.7 g/dL (ref 13.5–17.5)
NEUTROS ABS: 3640 /uL
PLATELETS: 356 10*3/uL (ref 150–399)
WBC: 5.6 10*3/mL

## 2015-07-18 LAB — BASIC METABOLIC PANEL
BUN: 9 mg/dL (ref 4–21)
Creatinine: 1 mg/dL (ref 0.6–1.3)
GLUCOSE: 53 mg/dL
Potassium: 4.4 mmol/L (ref 3.4–5.3)
Sodium: 139 mmol/L (ref 137–147)

## 2015-07-18 NOTE — Progress Notes (Signed)
Eugene Robinson Inpatient Post-Op Note  Patient ID: Eugene Robinson, male   DOB: 07-17-1970, 45 y.o.   MRN: [id number]  HISTORY: He returns today in follow-up. He's done very well at home. His wife has continued to administer his intravenous antibiotics. In the last week there is only been a scant amount of clear yellow serous drainage. There is nothing that looks like pus draining from the tubes. There is no erythema around the drain. He denied any fevers or chills. He denied any shortness of breath. He states that his pain is only minimal   Filed Vitals:   07/18/15 0904  BP: 109/71  Pulse: 76  Temp: 98.1 F (36.7 C)     EXAM: Resp: Lungs are clear bilaterally.  No respiratory distress, normal effort. Heart:  Regular without murmurs Abd:  Abdomen is soft, non distended and non tender. No masses are palpable.  There is no rebound and no guarding.  Neurological: Alert and oriented to person, place, and time. Coordination normal.  Skin: Skin is warm and dry. No rash noted. No diaphoretic. No erythema. No pallor.  Psychiatric: Normal mood and affect. Normal behavior. Judgment and thought content normal.   There is no erythema around the drain site. There is serous drainage within the tube. This is only a scant amount.  ASSESSMENT: I have independently reviewed the chest x-ray. I do not see any, Qing features on it. Since the tube is not draining it was removed. It was removed intact without problems.   PLAN:   I believe that he should remain on his intravenous antibiotics. I will see him back again in one week with another chest x-ray.    Eugene Marinimothy Jarika Robben, MD

## 2015-07-18 NOTE — Progress Notes (Signed)
Patient is follow up from hospital. Left sided chest tube present with minimal serous drainage. Right upper arm PICC line present.  Patient receiving IV home antibiotics.  Dr. Thelma Bargeaks removed chest tube, patient tolerated well.

## 2015-07-20 DIAGNOSIS — M869 Osteomyelitis, unspecified: Secondary | ICD-10-CM | POA: Diagnosis not present

## 2015-07-22 ENCOUNTER — Encounter: Payer: Self-pay | Admitting: Family Medicine

## 2015-07-22 ENCOUNTER — Encounter: Payer: Self-pay | Admitting: Emergency Medicine

## 2015-07-22 ENCOUNTER — Emergency Department: Payer: BLUE CROSS/BLUE SHIELD

## 2015-07-22 ENCOUNTER — Emergency Department
Admission: EM | Admit: 2015-07-22 | Discharge: 2015-07-22 | Disposition: A | Discharge status: Home / Self Care | Payer: BLUE CROSS/BLUE SHIELD | Attending: Emergency Medicine | Admitting: Emergency Medicine

## 2015-07-22 DIAGNOSIS — E669 Obesity, unspecified: Secondary | ICD-10-CM | POA: Diagnosis not present

## 2015-07-22 DIAGNOSIS — R509 Fever, unspecified: Secondary | ICD-10-CM | POA: Diagnosis not present

## 2015-07-22 DIAGNOSIS — Z79899 Other long term (current) drug therapy: Secondary | ICD-10-CM | POA: Insufficient documentation

## 2015-07-22 DIAGNOSIS — J90 Pleural effusion, not elsewhere classified: Secondary | ICD-10-CM | POA: Diagnosis not present

## 2015-07-22 DIAGNOSIS — I493 Ventricular premature depolarization: Secondary | ICD-10-CM | POA: Insufficient documentation

## 2015-07-22 DIAGNOSIS — Z959 Presence of cardiac and vascular implant and graft, unspecified: Secondary | ICD-10-CM | POA: Insufficient documentation

## 2015-07-22 DIAGNOSIS — I456 Pre-excitation syndrome: Secondary | ICD-10-CM | POA: Diagnosis not present

## 2015-07-22 DIAGNOSIS — Z95828 Presence of other vascular implants and grafts: Secondary | ICD-10-CM

## 2015-07-22 DIAGNOSIS — Z452 Encounter for adjustment and management of vascular access device: Secondary | ICD-10-CM | POA: Diagnosis not present

## 2015-07-22 DIAGNOSIS — M4624 Osteomyelitis of vertebra, thoracic region: Secondary | ICD-10-CM | POA: Diagnosis not present

## 2015-07-22 LAB — URINALYSIS COMPLETE WITH MICROSCOPIC (ARMC ONLY)
BILIRUBIN URINE: NEGATIVE
GLUCOSE, UA: NEGATIVE mg/dL
Ketones, ur: NEGATIVE mg/dL
LEUKOCYTES UA: NEGATIVE
Nitrite: NEGATIVE
Protein, ur: NEGATIVE mg/dL
Specific Gravity, Urine: 1.015 (ref 1.005–1.030)
pH: 5 (ref 5.0–8.0)

## 2015-07-22 LAB — COMPREHENSIVE METABOLIC PANEL
ALBUMIN: 3.4 g/dL — AB (ref 3.5–5.0)
ALK PHOS: 125 U/L (ref 38–126)
ALT: 55 U/L (ref 17–63)
AST: 44 U/L — AB (ref 15–41)
Anion gap: 9 (ref 5–15)
BILIRUBIN TOTAL: 0.8 mg/dL (ref 0.3–1.2)
BUN: 9 mg/dL (ref 6–20)
CO2: 25 mmol/L (ref 22–32)
Calcium: 8.9 mg/dL (ref 8.9–10.3)
Chloride: 100 mmol/L — ABNORMAL LOW (ref 101–111)
Creatinine, Ser: 0.95 mg/dL (ref 0.61–1.24)
GFR calc Af Amer: >60 mL/min (ref >60)
GFR calc non Af Amer: >60 mL/min (ref >60)
GLUCOSE: 101 mg/dL — AB (ref 65–99)
POTASSIUM: 3.8 mmol/L (ref 3.5–5.1)
SODIUM: 134 mmol/L — AB (ref 135–145)
TOTAL PROTEIN: 8.5 g/dL — AB (ref 6.5–8.1)

## 2015-07-22 LAB — CBC WITH DIFFERENTIAL/PLATELET
BASOS PCT: 1 %
Basophils Absolute: 0 10*3/uL (ref 0–0.1)
Eosinophils Absolute: 0.6 10*3/uL (ref 0–0.7)
Eosinophils Relative: 12 %
HEMATOCRIT: 41.5 % (ref 40.0–52.0)
HEMOGLOBIN: 14 g/dL (ref 13.0–18.0)
LYMPHS ABS: 0.7 10*3/uL — AB (ref 1.0–3.6)
Lymphocytes Relative: 13 %
MCH: 27.9 pg (ref 26.0–34.0)
MCHC: 33.7 g/dL (ref 32.0–36.0)
MCV: 82.7 fL (ref 80.0–100.0)
MONO ABS: 0.7 10*3/uL (ref 0.2–1.0)
MONOS PCT: 13 %
NEUTROS ABS: 3.3 10*3/uL (ref 1.4–6.5)
NEUTROS PCT: 63 %
PLATELETS: 262 10*3/uL (ref 150–440)
RBC: 5.02 MIL/uL (ref 4.40–5.90)
RDW: 13.8 % (ref 11.5–14.5)
WBC: 5.3 10*3/uL (ref 3.8–10.6)

## 2015-07-22 LAB — LACTIC ACID, PLASMA: Lactic Acid, Venous: 1.4 mmol/L (ref 0.5–2.0)

## 2015-07-22 NOTE — Discharge Instructions (Signed)
Please seek medical attention for any high fevers, chest pain, shortness of breath, change in behavior, persistent vomiting, bloody stool or any other new or concerning symptoms.  PICC Home Guide A peripherally inserted central catheter (PICC) is a long, thin, flexible tube that is inserted into a vein in the upper arm. It is a form of intravenous (IV) access. It is considered to be a "central" line because the tip of the PICC ends in a large vein in your chest. This large vein is called the superior vena cava (SVC). The PICC tip ends in the SVC because there is a lot of blood flow in the SVC. This allows medicines and IV fluids to be quickly distributed throughout the body. The PICC is inserted using a sterile technique by a specially trained nurse or physician. After the PICC is inserted, a chest X-ray exam is done to be sure it is in the correct place.  A PICC may be placed for different reasons, such as:  To give medicines and liquid nutrition that can only be given through a central line. Examples are:  Certain antibiotic treatments.  Chemotherapy.  Total parenteral nutrition (TPN).  To take frequent blood samples.  To give IV fluids and blood products.  If there is difficulty placing a peripheral intravenous (PIV) catheter. If taken care of properly, a PICC can remain in place for several months. A PICC can also allow a person to go home from the hospital early. Medicine and PICC care can be managed at home by a family member or home health care team. WHAT PROBLEMS CAN HAPPEN WHEN I HAVE A PICC? Problems with a PICC can occasionally occur. These may include the following:  A blood clot (thrombus) forming in or at the tip of the PICC. This can cause the PICC to become clogged. A clot-dissolving medicine called tissue plasminogen activator (tPA) can be given through the PICC to help break up the clot.  Inflammation of the vein (phlebitis) in which the PICC is placed. Signs of  inflammation may include redness, pain at the insertion site, red streaks, or being able to feel a "cord" in the vein where the PICC is located.  Infection in the PICC or at the insertion site. Signs of infection may include fever, chills, redness, swelling, or pus drainage from the PICC insertion site.  PICC movement (malposition). The PICC tip may move from its original position due to excessive physical activity, forceful coughing, sneezing, or vomiting.  A break or cut in the PICC. It is important to not use scissors near the PICC.  Nerve or tendon irritation or injury during PICC insertion. WHAT SHOULD I KEEP IN MIND ABOUT ACTIVITIES WHEN I HAVE A PICC?  You may bend your arm and move it freely. If your PICC is near or at the bend of your elbow, avoid activity with repeated motion at the elbow.  Rest at home for the remainder of the day following PICC line insertion.  Avoid lifting heavy objects as instructed by your health care provider.  Avoid using a crutch with the arm on the same side as your PICC. You may need to use a walker. WHAT SHOULD I KNOW ABOUT MY PICC DRESSING?  Keep your PICC bandage (dressing) clean and dry to prevent infection.  Ask your health care provider when you may shower. Ask your health care provider to teach you how to wrap the PICC when you do take a shower.  Change the PICC dressing as instructed by  your health care provider.  Change your PICC dressing if it becomes loose or wet. WHAT SHOULD I KNOW ABOUT PICC CARE?  Check the PICC insertion site daily for leakage, redness, swelling, or pain.  Do not take a bath, swim, or use hot tubs when you have a PICC. Cover PICC line with clear plastic wrap and tape to keep it dry while showering.  Flush the PICC as directed by your health care provider. Let your health care provider know right away if the PICC is difficult to flush or does not flush. Do not use force to flush the PICC.  Do not use a syringe  that is less than 10 mL to flush the PICC.  Never pull or tug on the PICC.  Avoid blood pressure checks on the arm with the PICC.  Keep your PICC identification card with you at all times.  Do not take the PICC out yourself. Only a trained clinical professional should remove the PICC. SEEK IMMEDIATE MEDICAL CARE IF:  Your PICC is accidentally pulled all the way out. If this happens, cover the insertion site with a bandage or gauze dressing. Do not throw the PICC away. Your health care provider will need to inspect it.  Your PICC was tugged or pulled and has partially come out. Do not  push the PICC back in.  There is any type of drainage, redness, or swelling where the PICC enters the skin.  You cannot flush the PICC, it is difficult to flush, or the PICC leaks around the insertion site when it is flushed.  You hear a "flushing" sound when the PICC is flushed.  You have pain, discomfort, or numbness in your arm, shoulder, or jaw on the same side as the PICC.  You feel your heart "racing" or skipping beats.  You notice a hole or tear in the PICC.  You develop chills or a fever. MAKE SURE YOU:   Understand these instructions.  Will watch your condition.  Will get help right away if you are not doing well or get worse.   This information is not intended to replace advice given to you by your health care provider. Make sure you discuss any questions you have with your health care provider.   Document Released: 10/04/2002 Document Revised: 04/20/2014 Document Reviewed: 12/05/2012 Elsevier Interactive Patient Education [employer]2016 Elsevier Inc.

## 2015-07-22 NOTE — ED Provider Notes (Signed)
[facility] Emergency Department Provider Note   ____________________________________________  Time seen: ~1510  I have reviewed the triage vital signs and the nursing notes.   HISTORY  Chief Complaint Fever and Vascular Access Problem   History limited by: Not Limited   HPI Eugene Robinson is a 45 y.o. male who presents to the emergency department today at the request of home health nurse because of concern for possible PICC line infection. The patient has a PICC line in his left upper extremity which was placed so that he could continue to receive IV antibiotics secondary to a pneumonia which required hospitalization. Today when the nurse went to draw back she was unable to draw blood. Furthermore she noticed a little bit of redness around the insertion site. In addition the patient had a fever of 99.2. Patient himself states that he feels pretty good. He states he has been running temperatures in the 99 range since discharge. He does not have any pain in the arm. He states he was able to give himself his dose of IV antibiotics this morning without problem. Denies any nausea or vomiting, denies any diarrhea. Denies any pain with urination.    Past Medical History  Diagnosis Date  . WPW (Wolff-Parkinson-White syndrome)   . Chicken pox   . GERD (gastroesophageal reflux disease)   . Kidney stones   . PVC's (premature ventricular contractions) 03/19/2015    Patient Active Problem List   Diagnosis Date Noted  . Vertebral osteomyelitis (HCC) 07/17/2015  . Empyema (HCC)   . Pleural effusion on right   . CAP (community acquired pneumonia) 07/02/2015  . GERD (gastroesophageal reflux disease) 07/02/2015  . WPW (Wolff-Parkinson-White syndrome) 11/07/2014  . Obesity (BMI 35.0-39.9 without comorbidity) (HCC) 11/07/2014  . Preventative health care 11/07/2014    Past Surgical History  Procedure Laterality Date  . Adenoidectomy      Current Outpatient Rx  Name   Route  Sig  Dispense  Refill  . acetaminophen (TYLENOL) 500 MG tablet   Oral   Take 1,000 mg by mouth every 8 (eight) hours as needed for moderate pain.         . metoprolol (LOPRESSOR) 50 MG tablet   Oral   Take 1 tablet (50 mg total) by mouth 2 (two) times daily.   60 tablet   11     Allergies Review of patient's allergies indicates no known allergies.  Family History  Problem Relation Age of Onset  . Cancer Mother     lung and breast  . Parkinson's disease Mother     Social History Social History  Substance Use Topics  . Smoking status: Never Smoker   . Smokeless tobacco: None  . Alcohol Use: No    Review of Systems  Constitutional: Negative for fever. Cardiovascular: Negative for chest pain. Respiratory: Negative for shortness of breath. Gastrointestinal: Negative for abdominal pain, vomiting and diarrhea. Neurological: Negative for headaches, focal weakness or numbness.  10-point ROS otherwise negative.  ____________________________________________   PHYSICAL EXAM:  VITAL SIGNS: ED Triage Vitals  Enc Vitals Group     BP 07/22/15 1410 121/75 mmHg     Pulse Rate 07/22/15 1410 83     Resp 07/22/15 1410 16     Temp 07/22/15 1410 98.3 F (36.8 C)     Temp Source 07/22/15 1410 Oral     SpO2 07/22/15 1410 97 %     Weight 07/22/15 1410 235 lb (106.595 kg)     Height  07/22/15 1410 5\' 10"  (1.778 m)     Head Cir --      Peak Flow --      Pain Score 07/22/15 1412 0   Constitutional: Alert and oriented. Well appearing and in no distress. Eyes: Conjunctivae are normal. PERRL. Normal extraocular movements. ENT   Head: Normocephalic and atraumatic.   Nose: No congestion/rhinnorhea.   Mouth/Throat: Mucous membranes are moist.   Neck: No stridor. Hematological/Lymphatic/Immunilogical: No cervical lymphadenopathy. Cardiovascular: Normal rate, regular rhythm.  No murmurs, rubs, or gallops. Respiratory: Normal respiratory effort without tachypnea  nor retractions. Breath sounds are clear and equal bilaterally. No wheezes/rales/rhonchi. Gastrointestinal: Soft and nontender. No distention. There is no CVA tenderness. Genitourinary: Deferred Musculoskeletal: Normal range of motion in all extremities. Picc line in place in left upper extremity. Small area of redness directly surrounding the insertion site. No pus. No tenderness to palpation. No swelling.  Neurologic:  Normal speech and language. No gross focal neurologic deficits are appreciated.  Skin:  Skin is warm, dry and intact. No rash noted. Psychiatric: Mood and affect are normal. Speech and behavior are normal. Patient exhibits appropriate insight and judgment.  ____________________________________________    LABS (pertinent positives/negatives)  Labs Reviewed  COMPREHENSIVE METABOLIC PANEL - Abnormal; Notable for the following:    Sodium 134 (*)    Chloride 100 (*)    Glucose, Bld 101 (*)    Total Protein 8.5 (*)    Albumin 3.4 (*)    AST 44 (*)    All other components within normal limits  CBC WITH DIFFERENTIAL/PLATELET - Abnormal; Notable for the following:    Lymphs Abs 0.7 (*)    All other components within normal limits  URINALYSIS COMPLETEWITH MICROSCOPIC ([facility] ONLY) - Abnormal; Notable for the following:    Color, Urine YELLOW (*)    APPearance HAZY (*)    Hgb urine dipstick 1+ (*)    Bacteria, UA FEW (*)    Squamous Epithelial / LPF 0-5 (*)    All other components within normal limits  CULTURE, BLOOD (ROUTINE X 2)  CULTURE, BLOOD (ROUTINE X 2)  URINE CULTURE  LACTIC ACID, PLASMA  LACTIC ACID, PLASMA     ____________________________________________   EKG  None  ____________________________________________    RADIOLOGY  CXR IMPRESSION: There is linear atelectasis or scarring in right midlung. Persistent trace right pleural effusion with right basilar atelectasis. Left lung is clear. No pulmonary  edema.   ____________________________________________   PROCEDURES  Procedure(s) performed: None  Critical Care performed: No  ____________________________________________   INITIAL IMPRESSION / ASSESSMENT AND PLAN / ED COURSE  Pertinent labs & imaging results that were available during my care of the patient were reviewed by me and considered in my medical decision making (see chart for details).  Patient presented to the emergency department today because of concerns for possibly infected PICC line. Patient did have a little bit of erythema around the insertion site however did not appear grossly infected. I think this could just be normal healing. Blood work did not show any signs of any infection. Patient afebrile here. Patient himself states clinically he has been feeling good and has not had any worsening. This point think patient is stable for discharge. Will patient follow-up doctor's appointment this week.  ____________________________________________   FINAL CLINICAL IMPRESSION(S) / ED DIAGNOSES  Final diagnoses:  S/P PICC central line placement     Phineas SemenGraydon Keishla Oyer, MD 07/22/15 [id number]

## 2015-07-22 NOTE — ED Notes (Signed)
STates he was recently hospitalized for 10 days and sent home with PICC line. Today home health nurse was unable to draw blood back on line, site reddened and temp 99.2.  Sent to ED for evaluation.

## 2015-07-23 DIAGNOSIS — Z452 Encounter for adjustment and management of vascular access device: Secondary | ICD-10-CM | POA: Diagnosis not present

## 2015-07-23 DIAGNOSIS — I456 Pre-excitation syndrome: Secondary | ICD-10-CM | POA: Diagnosis not present

## 2015-07-23 DIAGNOSIS — J90 Pleural effusion, not elsewhere classified: Secondary | ICD-10-CM | POA: Diagnosis not present

## 2015-07-23 DIAGNOSIS — M4624 Osteomyelitis of vertebra, thoracic region: Secondary | ICD-10-CM | POA: Diagnosis not present

## 2015-07-24 LAB — URINE CULTURE: CULTURE: NO GROWTH

## 2015-07-25 ENCOUNTER — Ambulatory Visit
Admission: RE | Admit: 2015-07-25 | Discharge: 2015-07-25 | Disposition: A | Discharge status: Home / Self Care | Payer: BLUE CROSS/BLUE SHIELD | Source: Ambulatory Visit | Attending: Cardiothoracic Surgery | Admitting: Cardiothoracic Surgery

## 2015-07-25 ENCOUNTER — Inpatient Hospital Stay (HOSPITAL_BASED_OUTPATIENT_CLINIC_OR_DEPARTMENT_OTHER): Payer: BLUE CROSS/BLUE SHIELD | Admitting: Cardiothoracic Surgery

## 2015-07-25 VITALS — BP 110/75 | HR 80 | Temp 97.7°F | Resp 20 | Wt 240.1 lb

## 2015-07-25 DIAGNOSIS — J90 Pleural effusion, not elsewhere classified: Secondary | ICD-10-CM | POA: Diagnosis not present

## 2015-07-25 DIAGNOSIS — R7982 Elevated C-reactive protein (CRP): Secondary | ICD-10-CM | POA: Diagnosis not present

## 2015-07-25 DIAGNOSIS — M4624 Osteomyelitis of vertebra, thoracic region: Secondary | ICD-10-CM | POA: Diagnosis not present

## 2015-07-25 DIAGNOSIS — I456 Pre-excitation syndrome: Secondary | ICD-10-CM | POA: Diagnosis not present

## 2015-07-25 DIAGNOSIS — Z452 Encounter for adjustment and management of vascular access device: Secondary | ICD-10-CM | POA: Diagnosis not present

## 2015-07-25 DIAGNOSIS — I10 Essential (primary) hypertension: Secondary | ICD-10-CM | POA: Diagnosis not present

## 2015-07-25 DIAGNOSIS — J869 Pyothorax without fistula: Secondary | ICD-10-CM | POA: Diagnosis not present

## 2015-07-25 DIAGNOSIS — Z79899 Other long term (current) drug therapy: Secondary | ICD-10-CM | POA: Diagnosis not present

## 2015-07-25 LAB — CULTURE, BLOOD (ROUTINE X 2)
Culture: NO GROWTH
Culture: NO GROWTH

## 2015-07-25 NOTE — Progress Notes (Signed)
Patient here today for follow up regarding empyema. Patient denies shortness of breath or pain today. Patient does report increase in cough, described as dry cough at most times. Patient reports productive cough upon waking up in the mornings.

## 2015-07-25 NOTE — Progress Notes (Signed)
[facility] Inpatient Post-Op Note  Patient ID: Eugene Robinson, male   DOB: 05-19-1970, 45 y.o.   MRN: [id number]  HISTORY: He returns today in follow-up. Earlier this week he did go to the emergency department whenever the home health nurse was unable to get blood return from his PICC line. While in the emergency room he did have a complete blood cell count obtained which was normal. He also states he had a low-grade fever of 99.2. He's been doing well at home however and has no new complaints. He denies any chest pain or fevers. He has a rare cough. Blood cultures obtained while in the emergency department are negative.   Filed Vitals:   07/25/15 0817  BP: 110/75  Pulse: 80  Temp: 97.7 F (36.5 C)  Resp: 20     EXAM: Resp: Lungs are clear bilaterally.  No respiratory distress, normal effort. Heart:  Regular without murmurs Abd:  Abdomen is soft, non distended and non tender. No masses are palpable.  There is no rebound and no guarding.  Neurological: Alert and oriented to person, place, and time. Coordination normal.  Skin: Skin is warm and dry. No rash noted. No diaphoretic. No erythema. No pallor.  Psychiatric: Normal mood and affect. Normal behavior. Judgment and thought content normal.    ASSESSMENT: He did have a chest x-ray made today which have independently reviewed. There is some linear atelectasis present in the right midlung zone. There is a trace pleural effusion. I see no complicating features on it.   PLAN:   He has a follow-up with Dr. Sampson Robinson next week. If Dr. Sampson Robinson agrees I think he could go to oral antibiotics. I did not make a return visit form as it would appear that he is cured of his empyema.    [name], MD

## 2015-07-27 DIAGNOSIS — M869 Osteomyelitis, unspecified: Secondary | ICD-10-CM | POA: Diagnosis not present

## 2015-07-27 DIAGNOSIS — J189 Pneumonia, unspecified organism: Secondary | ICD-10-CM | POA: Diagnosis not present

## 2015-07-27 DIAGNOSIS — Z792 Long term (current) use of antibiotics: Secondary | ICD-10-CM | POA: Diagnosis not present

## 2015-07-27 DIAGNOSIS — M4620 Osteomyelitis of vertebra, site unspecified: Secondary | ICD-10-CM | POA: Diagnosis not present

## 2015-07-27 DIAGNOSIS — J869 Pyothorax without fistula: Secondary | ICD-10-CM | POA: Diagnosis not present

## 2015-07-27 LAB — CULTURE, BLOOD (ROUTINE X 2)
CULTURE: NO GROWTH
Culture: NO GROWTH

## 2015-07-29 DIAGNOSIS — J869 Pyothorax without fistula: Secondary | ICD-10-CM | POA: Insufficient documentation

## 2015-07-29 DIAGNOSIS — J189 Pneumonia, unspecified organism: Secondary | ICD-10-CM | POA: Diagnosis not present

## 2015-07-29 DIAGNOSIS — Z792 Long term (current) use of antibiotics: Secondary | ICD-10-CM | POA: Diagnosis not present

## 2015-07-29 DIAGNOSIS — M4620 Osteomyelitis of vertebra, site unspecified: Secondary | ICD-10-CM | POA: Diagnosis not present

## 2015-07-30 ENCOUNTER — Telehealth: Payer: Self-pay

## 2015-07-30 DIAGNOSIS — M4624 Osteomyelitis of vertebra, thoracic region: Secondary | ICD-10-CM | POA: Diagnosis not present

## 2015-07-30 DIAGNOSIS — Z79899 Other long term (current) drug therapy: Secondary | ICD-10-CM | POA: Diagnosis not present

## 2015-07-30 DIAGNOSIS — Z452 Encounter for adjustment and management of vascular access device: Secondary | ICD-10-CM | POA: Diagnosis not present

## 2015-07-30 DIAGNOSIS — I456 Pre-excitation syndrome: Secondary | ICD-10-CM | POA: Diagnosis not present

## 2015-07-30 DIAGNOSIS — J90 Pleural effusion, not elsewhere classified: Secondary | ICD-10-CM | POA: Diagnosis not present

## 2015-07-30 DIAGNOSIS — D649 Anemia, unspecified: Secondary | ICD-10-CM | POA: Diagnosis not present

## 2015-07-30 DIAGNOSIS — I10 Essential (primary) hypertension: Secondary | ICD-10-CM | POA: Diagnosis not present

## 2015-07-30 NOTE — Telephone Encounter (Signed)
Patient's disability form from [facility] was filled out and faxed.

## 2015-07-31 ENCOUNTER — Telehealth: Payer: Self-pay | Admitting: *Deleted

## 2015-07-31 DIAGNOSIS — M869 Osteomyelitis, unspecified: Secondary | ICD-10-CM | POA: Diagnosis not present

## 2015-07-31 NOTE — Telephone Encounter (Signed)
FYI [facility] wanted to note that patient refused his nurse visit for the week, he stated that he felt good today and will see the nurse next week.

## 2015-07-31 NOTE — Telephone Encounter (Signed)
FYI

## 2015-08-01 DIAGNOSIS — J90 Pleural effusion, not elsewhere classified: Secondary | ICD-10-CM | POA: Diagnosis not present

## 2015-08-01 DIAGNOSIS — I456 Pre-excitation syndrome: Secondary | ICD-10-CM | POA: Diagnosis not present

## 2015-08-01 DIAGNOSIS — M4624 Osteomyelitis of vertebra, thoracic region: Secondary | ICD-10-CM | POA: Diagnosis not present

## 2015-08-01 DIAGNOSIS — Z452 Encounter for adjustment and management of vascular access device: Secondary | ICD-10-CM | POA: Diagnosis not present

## 2015-08-06 DIAGNOSIS — Z452 Encounter for adjustment and management of vascular access device: Secondary | ICD-10-CM | POA: Diagnosis not present

## 2015-08-06 DIAGNOSIS — Z79899 Other long term (current) drug therapy: Secondary | ICD-10-CM | POA: Diagnosis not present

## 2015-08-06 DIAGNOSIS — M4624 Osteomyelitis of vertebra, thoracic region: Secondary | ICD-10-CM | POA: Diagnosis not present

## 2015-08-06 DIAGNOSIS — R7982 Elevated C-reactive protein (CRP): Secondary | ICD-10-CM | POA: Diagnosis not present

## 2015-08-06 DIAGNOSIS — I10 Essential (primary) hypertension: Secondary | ICD-10-CM | POA: Diagnosis not present

## 2015-08-06 DIAGNOSIS — I456 Pre-excitation syndrome: Secondary | ICD-10-CM | POA: Diagnosis not present

## 2015-08-06 DIAGNOSIS — J90 Pleural effusion, not elsewhere classified: Secondary | ICD-10-CM | POA: Diagnosis not present

## 2015-08-09 DIAGNOSIS — M869 Osteomyelitis, unspecified: Secondary | ICD-10-CM | POA: Diagnosis not present

## 2015-08-13 DIAGNOSIS — D649 Anemia, unspecified: Secondary | ICD-10-CM | POA: Diagnosis not present

## 2015-08-13 DIAGNOSIS — R7982 Elevated C-reactive protein (CRP): Secondary | ICD-10-CM | POA: Diagnosis not present

## 2015-08-13 DIAGNOSIS — I10 Essential (primary) hypertension: Secondary | ICD-10-CM | POA: Diagnosis not present

## 2015-08-17 DIAGNOSIS — M4620 Osteomyelitis of vertebra, site unspecified: Secondary | ICD-10-CM | POA: Diagnosis not present

## 2015-08-17 DIAGNOSIS — J869 Pyothorax without fistula: Secondary | ICD-10-CM | POA: Diagnosis not present

## 2015-08-17 DIAGNOSIS — Z792 Long term (current) use of antibiotics: Secondary | ICD-10-CM | POA: Diagnosis not present

## 2015-08-17 DIAGNOSIS — M869 Osteomyelitis, unspecified: Secondary | ICD-10-CM | POA: Diagnosis not present

## 2015-08-17 DIAGNOSIS — J189 Pneumonia, unspecified organism: Secondary | ICD-10-CM | POA: Diagnosis not present

## 2015-08-19 DIAGNOSIS — J869 Pyothorax without fistula: Secondary | ICD-10-CM | POA: Diagnosis not present

## 2015-08-19 DIAGNOSIS — M4620 Osteomyelitis of vertebra, site unspecified: Secondary | ICD-10-CM | POA: Diagnosis not present

## 2015-08-19 DIAGNOSIS — Z792 Long term (current) use of antibiotics: Secondary | ICD-10-CM | POA: Diagnosis not present

## 2015-08-19 DIAGNOSIS — J189 Pneumonia, unspecified organism: Secondary | ICD-10-CM | POA: Diagnosis not present

## 2015-08-19 LAB — ACID FAST CULTURE WITH REFLEXED SENSITIVITIES: ACID FAST CULTURE - AFSCU3: NEGATIVE

## 2015-08-19 LAB — ACID FAST SMEAR (AFB): ACID FAST SMEAR - AFSCU2: NEGATIVE

## 2015-08-20 DIAGNOSIS — I10 Essential (primary) hypertension: Secondary | ICD-10-CM | POA: Diagnosis not present

## 2015-08-20 DIAGNOSIS — R7982 Elevated C-reactive protein (CRP): Secondary | ICD-10-CM | POA: Diagnosis not present

## 2015-08-26 ENCOUNTER — Encounter: Payer: Self-pay | Admitting: Family Medicine

## 2015-08-26 ENCOUNTER — Ambulatory Visit (INDEPENDENT_AMBULATORY_CARE_PROVIDER_SITE_OTHER)
Admission: RE | Admit: 2015-08-26 | Discharge: 2015-08-26 | Disposition: A | Discharge status: Home / Self Care | Payer: BLUE CROSS/BLUE SHIELD | Source: Ambulatory Visit | Attending: Family Medicine | Admitting: Family Medicine

## 2015-08-26 ENCOUNTER — Ambulatory Visit (INDEPENDENT_AMBULATORY_CARE_PROVIDER_SITE_OTHER): Payer: BLUE CROSS/BLUE SHIELD | Admitting: Family Medicine

## 2015-08-26 VITALS — BP 122/78 | HR 73 | Temp 98.1°F | Ht 70.0 in | Wt 248.5 lb

## 2015-08-26 DIAGNOSIS — M4620 Osteomyelitis of vertebra, site unspecified: Secondary | ICD-10-CM | POA: Diagnosis not present

## 2015-08-26 DIAGNOSIS — Z792 Long term (current) use of antibiotics: Secondary | ICD-10-CM | POA: Diagnosis not present

## 2015-08-26 DIAGNOSIS — R05 Cough: Secondary | ICD-10-CM

## 2015-08-26 DIAGNOSIS — J189 Pneumonia, unspecified organism: Secondary | ICD-10-CM | POA: Diagnosis not present

## 2015-08-26 DIAGNOSIS — J869 Pyothorax without fistula: Secondary | ICD-10-CM | POA: Diagnosis not present

## 2015-08-26 DIAGNOSIS — R059 Cough, unspecified: Secondary | ICD-10-CM

## 2015-08-26 NOTE — Progress Notes (Signed)
   Subjective:  Patient ID: Eugene Robinson, male    DOB: 05/19/1970  Age: 45 y.o. MRN: [id number]  CC: Cough  HPI:  45 year old male presents with complaints of cough.  Patient reports a 1.5 week history of cough. Cough is productive of clear sputum, particularly in the am. Cough mild in severity. No associated SOB. No fever, chills. No chest pain. No exacerbating or relieving factors. No meds tried. He is currently on antibiotics. No other complaints today.  Social Hx   Social History   Social History  . Marital Status: Married    Spouse Name: N/A  . Number of Children: N/A  . Years of Education: N/A   Social History Main Topics  . Smoking status: Never Smoker   . Smokeless tobacco: None  . Alcohol Use: No  . Drug Use: No  . Sexual Activity: Not Asked   Other Topics Concern  . None   Social History Narrative   Review of Systems  Constitutional: Negative for fever.  Respiratory: Positive for cough. Negative for shortness of breath.    Objective:  BP 122/78 mmHg  Pulse 73  Temp(Src) 98.1 F (36.7 C) (Oral)  Ht 5\' 10"  (1.778 m)  Wt 248 lb 8 oz (112.719 kg)  BMI 35.66 kg/m2  SpO2 97%  BP/Weight 08/26/2015 07/25/2015 07/22/2015  Systolic BP 122 110 121  Diastolic BP 78 75 75  Wt. (Lbs) 248.5 240.08 235  BMI 35.66 34.45 33.72   Physical Exam  Constitutional: He is oriented to person, place, and time. He appears well-developed. No distress.  Cardiovascular: Normal rate and regular rhythm.   Pulmonary/Chest: Effort normal. No respiratory distress. He has no wheezes. He has no rales.  Neurological: He is alert and oriented to person, place, and time.  Skin:  Left arm with PICC line in place.  Vitals reviewed.   Lab Results  Component Value Date   WBC 5.3 07/22/2015   HGB 14.0 07/22/2015   HCT 41.5 07/22/2015   PLT 262 07/22/2015   GLUCOSE 101* 07/22/2015   CHOL 129 11/14/2014   TRIG 109.0 11/14/2014   HDL 33.50* 11/14/2014   LDLCALC 74 11/14/2014   ALT 55  07/22/2015   AST 44* 07/22/2015   NA 134* 07/22/2015   K 3.8 07/22/2015   CL 100* 07/22/2015   CREATININE 0.95 07/22/2015   BUN 9 07/22/2015   CO2 25 07/22/2015   INR 1.31 07/04/2015   HGBA1C 5.4 11/14/2014    Assessment & Plan:   Problem List Items Addressed This Visit    Cough - Primary    New problem. Exam normal today. However, given recent history of empyema, xray ordered today. Xray was reviewed independently. Interpretation: Stable chronic small R pleural effusion. No acute infiltrate. I discussed case with Pulmonologist Dr. Sung AmabileSimonds. Given stability and recent empyema, this is likely from scarring/pleural change per Dr. Sung AmabileSimonds. He recommended pulm follow up. Will discuss with patient. Supportive care at this time.       Relevant Orders   DG Chest 2 View (Completed)     Meds ordered this encounter  Medications  . ciprofloxacin (CIPRO) 500 MG tablet    Sig: Take by mouth.  . doxycycline (VIBRAMYCIN) 100 MG capsule    Sig: Take by mouth.   Follow-up: PRN  Everlene OtherJayce Silvie Obremski DO [facility] [place]

## 2015-08-26 NOTE — Patient Instructions (Signed)
We will call with your xray results.  Bring your paperwork and ill fill it out.  Take care  Dr. Adriana Simasook

## 2015-08-26 NOTE — Progress Notes (Signed)
Pre visit review using our clinic review tool, if applicable. No additional management support is needed unless otherwise documented below in the visit note. 

## 2015-08-27 DIAGNOSIS — M4620 Osteomyelitis of vertebra, site unspecified: Secondary | ICD-10-CM | POA: Diagnosis not present

## 2015-08-27 DIAGNOSIS — R059 Cough, unspecified: Secondary | ICD-10-CM | POA: Insufficient documentation

## 2015-08-27 DIAGNOSIS — R05 Cough: Secondary | ICD-10-CM | POA: Insufficient documentation

## 2015-08-27 HISTORY — DX: Cough, unspecified: R05.9

## 2015-08-27 NOTE — Assessment & Plan Note (Addendum)
New problem. Exam normal today. However, given recent history of empyema, xray ordered today. Xray was reviewed independently. Interpretation: Stable chronic small R pleural effusion. No acute infiltrate. I discussed case with Pulmonologist Dr. Sung AmabileSimonds. Given stability and recent empyema, this is likely from scarring/pleural change per Dr. Sung AmabileSimonds. He recommended pulm follow up. Will discuss with patient. Supportive care at this time.

## 2015-08-28 ENCOUNTER — Encounter: Payer: Self-pay | Admitting: Family Medicine

## 2015-08-29 ENCOUNTER — Ambulatory Visit: Payer: Self-pay | Admitting: Family Medicine

## 2015-08-29 ENCOUNTER — Telehealth: Payer: Self-pay | Admitting: *Deleted

## 2015-08-29 NOTE — Telephone Encounter (Signed)
Patient wife stated that she missed a call in regards to a mychart message, please call Tresa EndoKelly his wife [phone]

## 2015-08-29 NOTE — Telephone Encounter (Signed)
Wife called and information put in letter that was needed

## 2015-09-03 ENCOUNTER — Encounter: Payer: Self-pay | Admitting: Family Medicine

## 2015-09-10 DIAGNOSIS — M4620 Osteomyelitis of vertebra, site unspecified: Secondary | ICD-10-CM | POA: Diagnosis not present

## 2015-09-10 DIAGNOSIS — J869 Pyothorax without fistula: Secondary | ICD-10-CM | POA: Diagnosis not present

## 2015-09-14 ENCOUNTER — Encounter: Payer: Self-pay | Admitting: Family Medicine

## 2015-09-16 ENCOUNTER — Telehealth: Payer: Self-pay | Admitting: *Deleted

## 2015-09-16 NOTE — Telephone Encounter (Signed)
Please advise a place on Dr. Adriana Simasook schedule to place a 30 minute office visit, on Thursday afternoon.

## 2015-09-16 NOTE — Telephone Encounter (Signed)
If patient wants to come in Thursday afternoon. He can be placed. Even if its a same day spot this time

## 2015-09-19 ENCOUNTER — Encounter: Payer: Self-pay | Admitting: Family Medicine

## 2015-09-19 ENCOUNTER — Ambulatory Visit (INDEPENDENT_AMBULATORY_CARE_PROVIDER_SITE_OTHER): Payer: BLUE CROSS/BLUE SHIELD | Admitting: Family Medicine

## 2015-09-19 VITALS — BP 124/76 | HR 73 | Temp 98.2°F | Wt 254.8 lb

## 2015-09-19 DIAGNOSIS — R1031 Right lower quadrant pain: Secondary | ICD-10-CM

## 2015-09-19 NOTE — Patient Instructions (Signed)
We will call regarding your CT.  Take care  Dr. Adriana Simasook

## 2015-09-20 DIAGNOSIS — R1031 Right lower quadrant pain: Secondary | ICD-10-CM | POA: Insufficient documentation

## 2015-09-20 NOTE — Progress Notes (Signed)
   Subjective:  Patient ID: Eugene Robinson, male    DOB: 01/04/1971  Age: 45 y.o. MRN: [id number]  CC: Abdominal pain  HPI:  45 year old male presents with complaints of abdominal pain.  Patient has recently stopped antibiotics per his ID physician after having a collocated hospital stay where he was diagnosed with discitis. He states that he has recently developed abdominal pain. He states it is been going on for the past 2-3 weeks. It's located in the right flank to right lower quadrant. He describes it as pinching/sharp pain. He states it's proximal 3 out of 10 in severity. No associated nausea, vomiting, diarrhea, constipation. No fevers or chills. He states that he otherwise feels well. He states that the pain is intermittent. No known exacerbating or relieving factors. No other complaints today.  Social Hx   Social History   Social History  . Marital Status: Married    Spouse Name: N/A  . Number of Children: N/A  . Years of Education: N/A   Social History Main Topics  . Smoking status: Never Smoker   . Smokeless tobacco: None  . Alcohol Use: No  . Drug Use: No  . Sexual Activity: Not Asked   Other Topics Concern  . None   Social History Narrative   Review of Systems  Constitutional: Negative for fever and chills.  Gastrointestinal: Positive for abdominal pain. Negative for nausea, vomiting, diarrhea and constipation.   Objective:  BP 124/76 mmHg  Pulse 73  Temp(Src) 98.2 F (36.8 C)  Wt 254 lb 12 oz (115.554 kg)  SpO2 97%  BP/Weight 09/19/2015 08/26/2015 07/25/2015  Systolic BP 124 122 110  Diastolic BP 76 78 75  Wt. (Lbs) 254.75 248.5 240.08  BMI 36.55 35.66 34.45   Physical Exam  Constitutional: He is oriented to person, place, and time. He appears well-developed. No distress.  Cardiovascular: Normal rate and regular rhythm.   Pulmonary/Chest: Effort normal and breath sounds normal. He has no wheezes. He has no rales.  Abdominal: Soft. He exhibits no  distension. There is no rebound and no guarding.  Mild pain in the right lower quadrant.  Neurological: He is alert and oriented to person, place, and time.  Psychiatric: He has a normal mood and affect.  Vitals reviewed.  Lab Results  Component Value Date   WBC 5.3 07/22/2015   HGB 14.0 07/22/2015   HCT 41.5 07/22/2015   PLT 262 07/22/2015   GLUCOSE 101* 07/22/2015   CHOL 129 11/14/2014   TRIG 109.0 11/14/2014   HDL 33.50* 11/14/2014   LDLCALC 74 11/14/2014   ALT 55 07/22/2015   AST 44* 07/22/2015   NA 134* 07/22/2015   K 3.8 07/22/2015   CL 100* 07/22/2015   CREATININE 0.95 07/22/2015   BUN 9 07/22/2015   CO2 25 07/22/2015   INR 1.31 07/04/2015   HGBA1C 5.4 11/14/2014   Assessment & Plan:   Problem List Items Addressed This Visit    RLQ abdominal pain - Primary    New acute problem. Uncertain diagnosis/prognosis at time.  This was slightly similar to his prior presentation when he was hospitalized. Given this, obtaining CT abdomen pelvis.      Relevant Orders   CT Abdomen Pelvis Wo Contrast     Follow-up: PRN  Everlene OtherJayce Berman Grainger DO [facility] [place]

## 2015-09-20 NOTE — Assessment & Plan Note (Signed)
New acute problem. Uncertain diagnosis/prognosis at time.  This was slightly similar to his prior presentation when he was hospitalized. Given this, obtaining CT abdomen pelvis.

## 2015-09-24 ENCOUNTER — Ambulatory Visit
Admission: RE | Admit: 2015-09-24 | Discharge: 2015-09-24 | Disposition: A | Discharge status: Home / Self Care | Payer: BLUE CROSS/BLUE SHIELD | Source: Ambulatory Visit | Attending: Family Medicine | Admitting: Family Medicine

## 2015-09-24 DIAGNOSIS — R1031 Right lower quadrant pain: Secondary | ICD-10-CM | POA: Insufficient documentation

## 2016-03-20 ENCOUNTER — Telehealth: Payer: Self-pay | Admitting: Family Medicine

## 2016-03-20 ENCOUNTER — Ambulatory Visit (INDEPENDENT_AMBULATORY_CARE_PROVIDER_SITE_OTHER): Payer: BLUE CROSS/BLUE SHIELD | Admitting: *Deleted

## 2016-03-20 DIAGNOSIS — Z23 Encounter for immunization: Secondary | ICD-10-CM | POA: Diagnosis not present

## 2016-03-20 NOTE — Telephone Encounter (Signed)
Please advise 

## 2016-03-20 NOTE — Progress Notes (Signed)
>  patient presented for Pneumovax and Flu vaccine, patient tolerated both well.

## 2016-03-20 NOTE — Telephone Encounter (Signed)
Patient in office for nurse visit for flu and pneumovax , advised nurse he was to have repeat MRI thoracic Spine before end of year in an open MRI ?

## 2016-03-23 ENCOUNTER — Other Ambulatory Visit: Payer: Self-pay | Admitting: Family Medicine

## 2016-03-23 DIAGNOSIS — M4620 Osteomyelitis of vertebra, site unspecified: Secondary | ICD-10-CM

## 2016-03-27 ENCOUNTER — Other Ambulatory Visit: Payer: Self-pay | Admitting: Internal Medicine

## 2016-04-18 DIAGNOSIS — J069 Acute upper respiratory infection, unspecified: Secondary | ICD-10-CM | POA: Diagnosis not present

## 2016-04-20 ENCOUNTER — Other Ambulatory Visit: Payer: Self-pay | Admitting: Family Medicine

## 2016-04-20 ENCOUNTER — Encounter: Payer: Self-pay | Admitting: Family Medicine

## 2016-04-20 DIAGNOSIS — R059 Cough, unspecified: Secondary | ICD-10-CM

## 2016-04-20 DIAGNOSIS — R05 Cough: Secondary | ICD-10-CM

## 2016-04-21 ENCOUNTER — Ambulatory Visit (INDEPENDENT_AMBULATORY_CARE_PROVIDER_SITE_OTHER): Payer: BLUE CROSS/BLUE SHIELD

## 2016-04-21 DIAGNOSIS — R05 Cough: Secondary | ICD-10-CM

## 2016-04-21 DIAGNOSIS — J984 Other disorders of lung: Secondary | ICD-10-CM | POA: Diagnosis not present

## 2016-04-21 DIAGNOSIS — R059 Cough, unspecified: Secondary | ICD-10-CM

## 2016-07-11 IMAGING — DX DG CHEST 2V
2 series · 2 of 2 positions shown · non-contrast
Comparison: 07/25/2015

CLINICAL DATA: Cough, chest tightness

EXAM:
CHEST  2 VIEW

[chest pa]
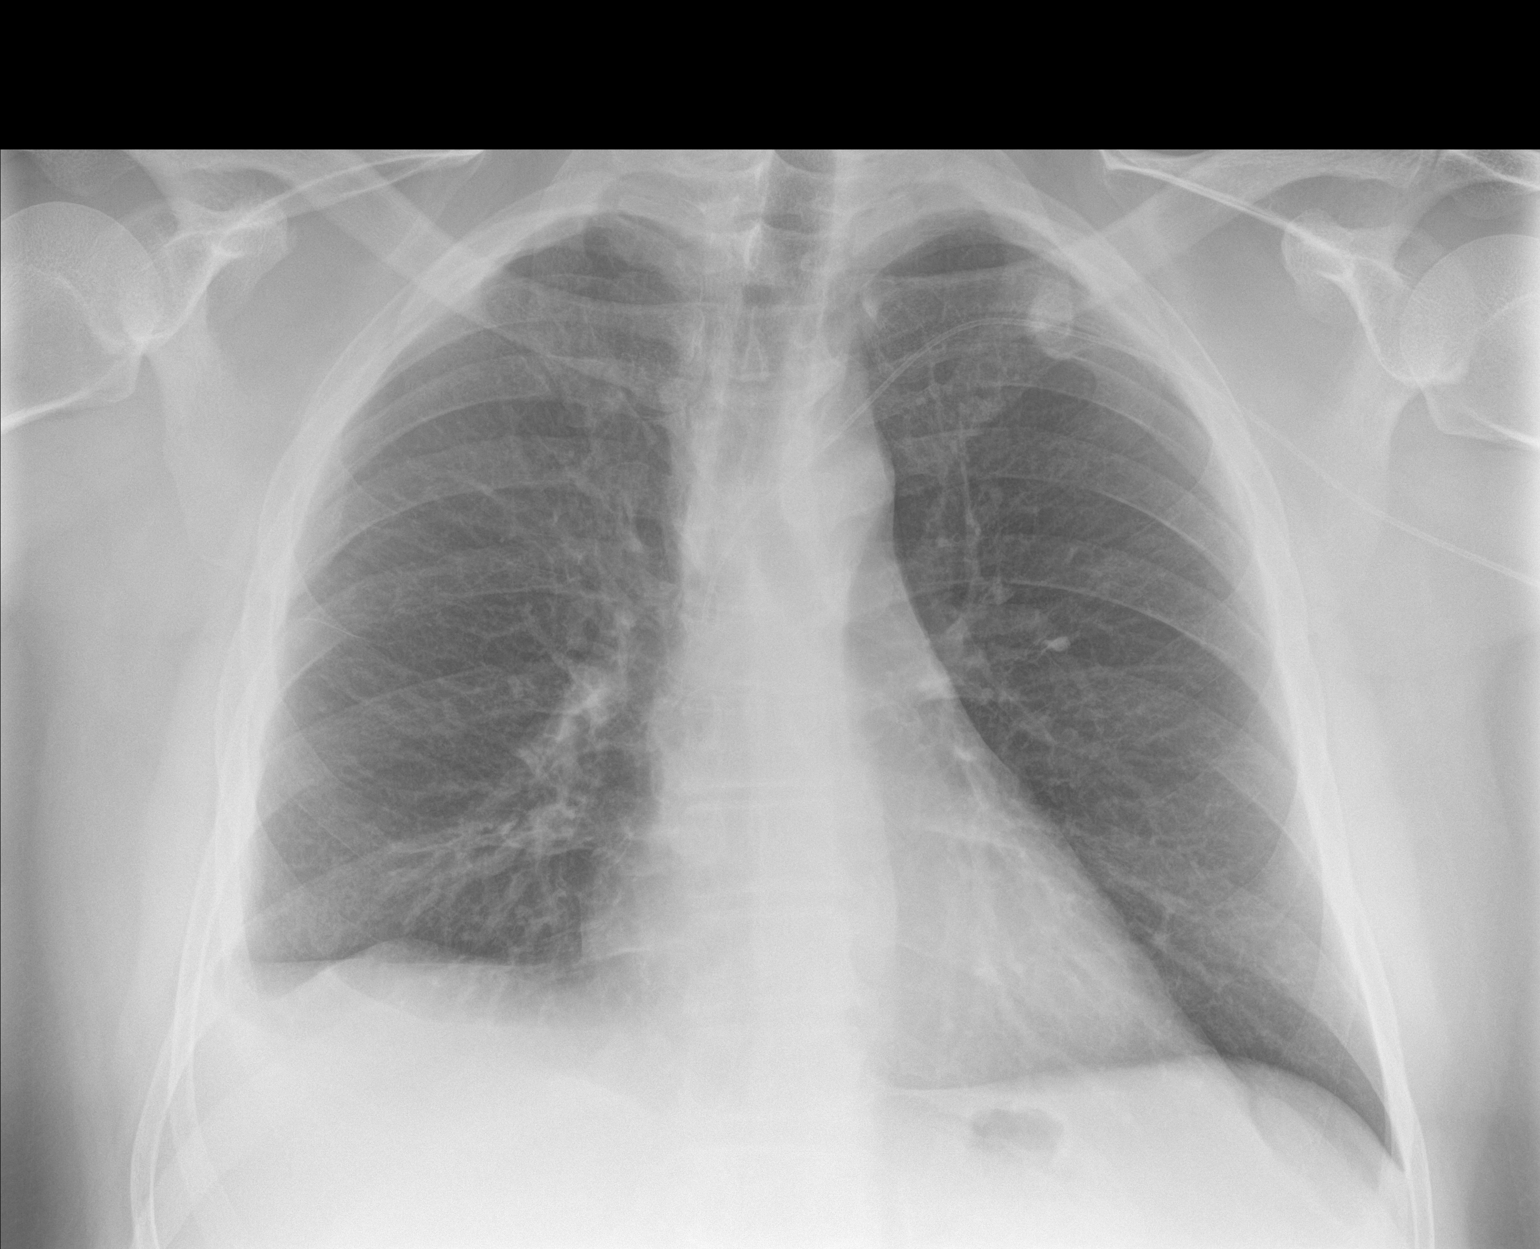

[chest lat]
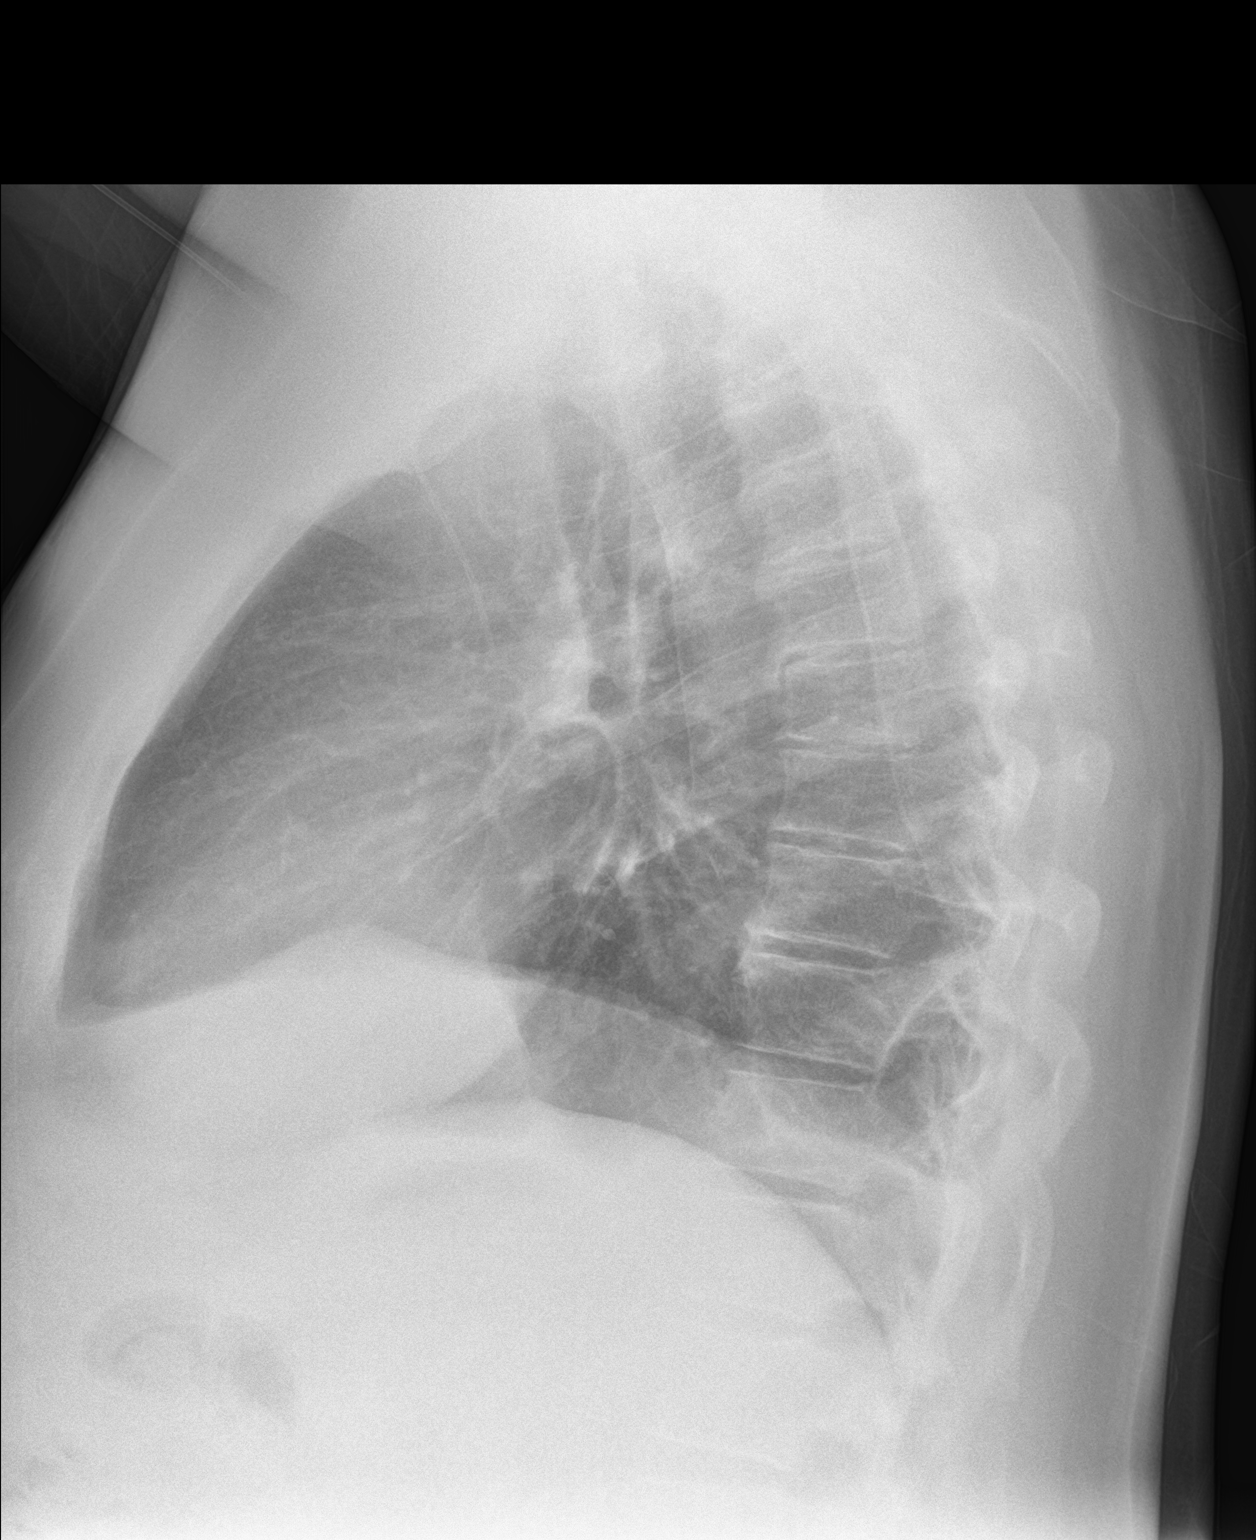

[2 of 2 positions shown; findings below may reference images not displayed]

FINDINGS: Chronic blunting of the right costophrenic angle with a stable small
right pleural effusion. No focal consolidation. Left lung is clear.

No pneumothorax.

The heart is normal in size.

Left arm PICC terminates in the mid SVC.
IMPRESSION: No active cardiopulmonary disease.

Stable small right pleural effusion.

## 2016-07-30 ENCOUNTER — Ambulatory Visit: Payer: BLUE CROSS/BLUE SHIELD | Admitting: Internal Medicine

## 2016-08-09 IMAGING — CT CT ABD-PELV W/O CM
2 of 4 series · 16 of 46 positions shown, 18 images · non-contrast
Comparison: CT abdomen pelvis of 07/02/2015

ADDENDUM:
The T11-12 interspace is unremarkable other than osteophyte
formation. No indistinctness of the endplate is seen and no
associated prominent soft tissue is evident.
CLINICAL DATA: History T11-T12 infection approximately 4 months ago
as well as pneumonia and a pneumothorax, now with right upper
quadrant and right lower quadrant pain for 2 weeks

EXAM:
CT ABDOMEN AND PELVIS WITHOUT CONTRAST
TECHNIQUE: Multidetector CT imaging of the abdomen and pelvis was performed
following the standard protocol without IV contrast.

[Series 2: abd pel wo · axial · 0.84mm/px · z∈[-1044,-569]mm · 13 of 105 slices shown, 15 images]
[im 5/105  soft-tissue]
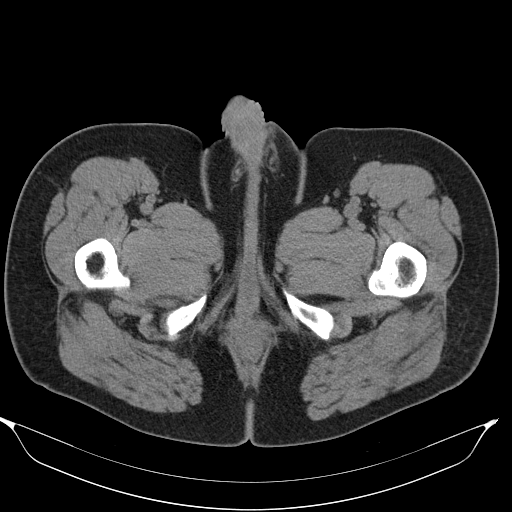
[im 5/105  bone]
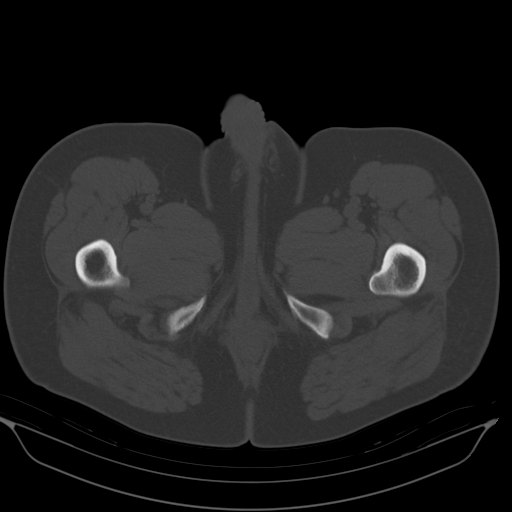
[im 14/105  soft-tissue]
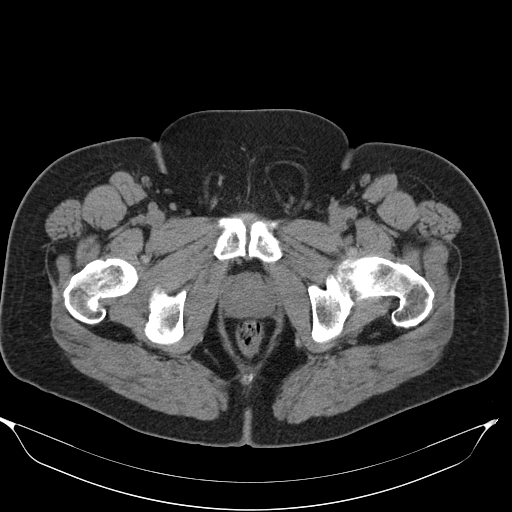
[im 23/105  soft-tissue]
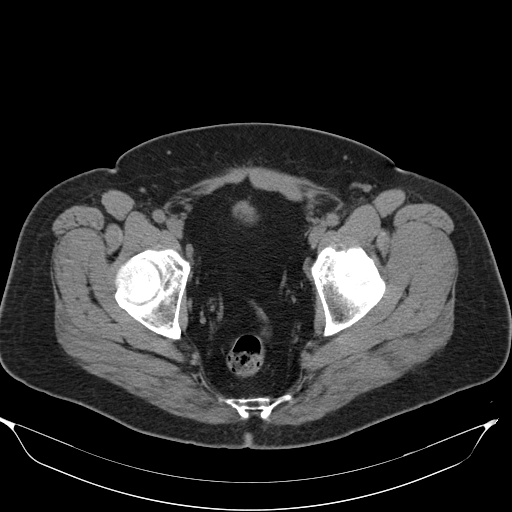
[im 28/105  soft-tissue]
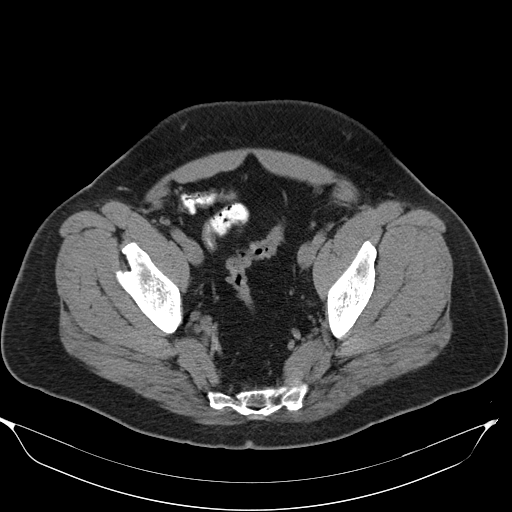
[im 37/105  soft-tissue]
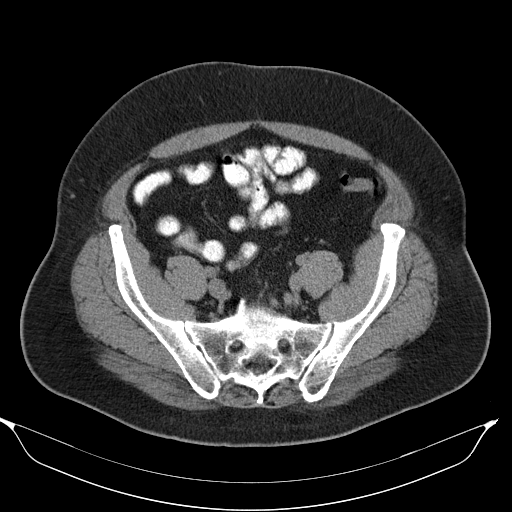
[im 46/105  soft-tissue]
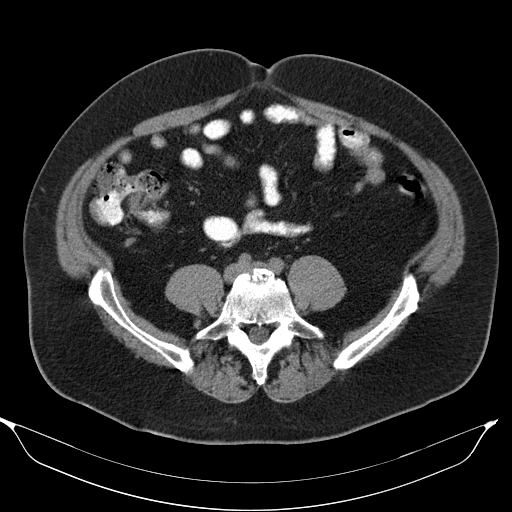
[im 55/105  soft-tissue]
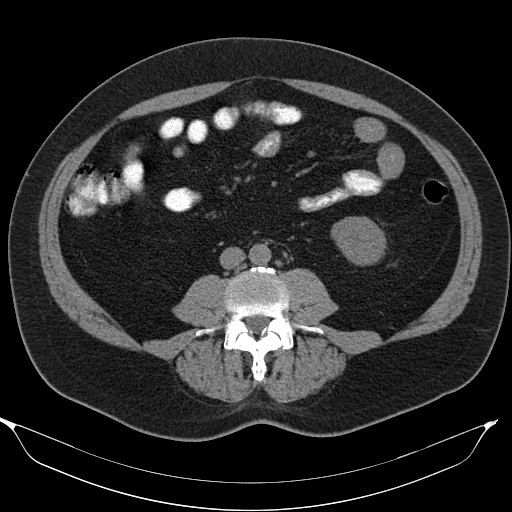
[im 59/105  soft-tissue]
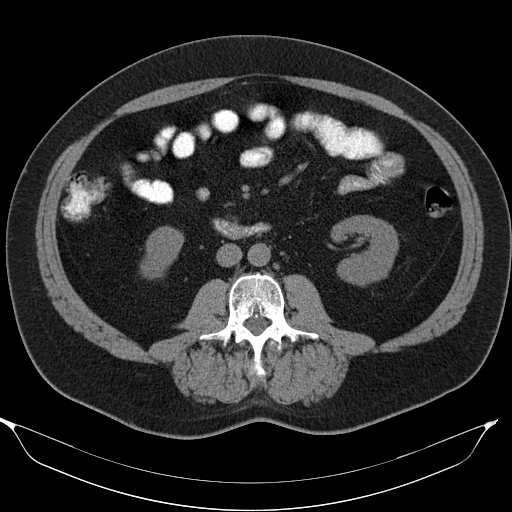
[im 68/105  soft-tissue]
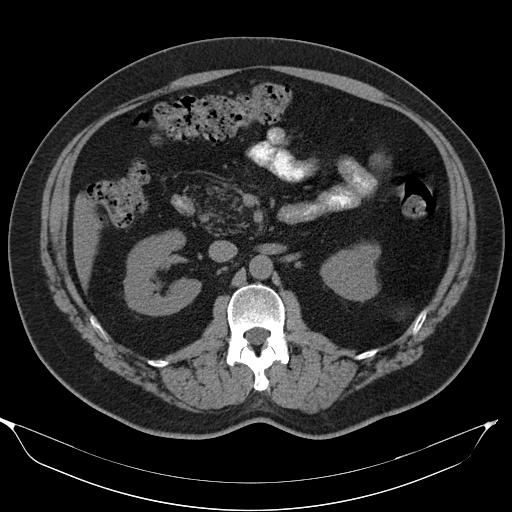
[im 68/105  bone]
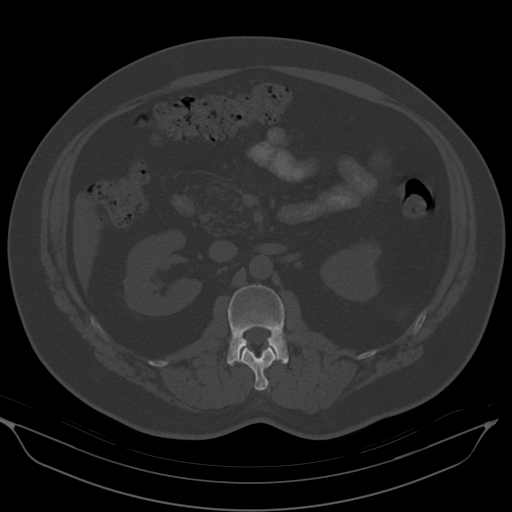
[im 77/105  soft-tissue]
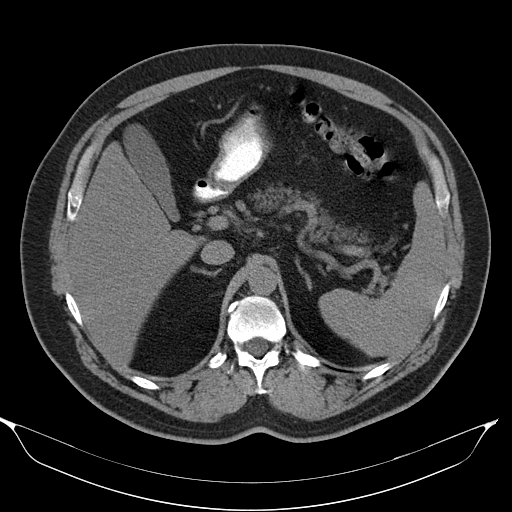
[im 82/105  soft-tissue]
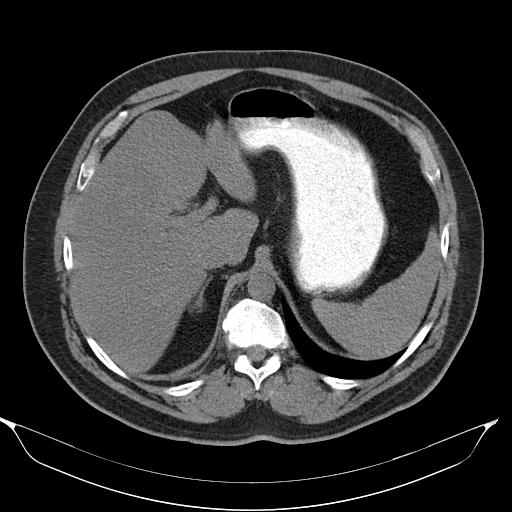
[im 91/105  soft-tissue]
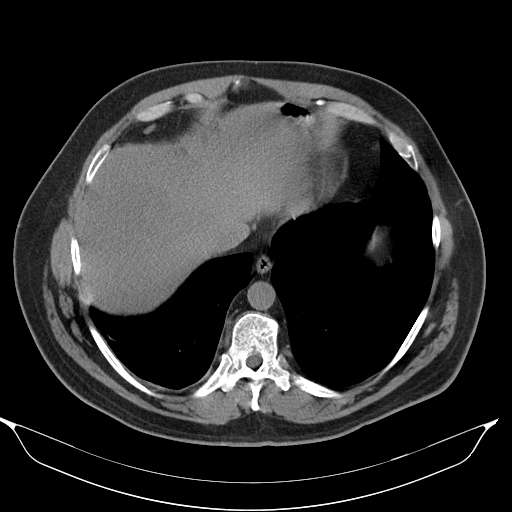
[im 100/105  soft-tissue]
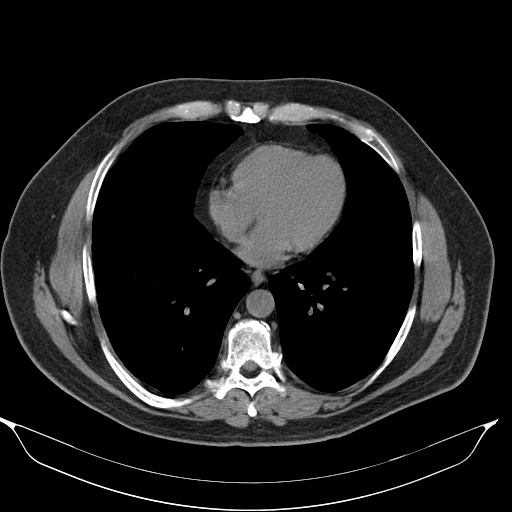

[Series 602: coronal · coronal · 1.01mm/px · 3 of 147 slices shown]
[im 49/147  soft-tissue]
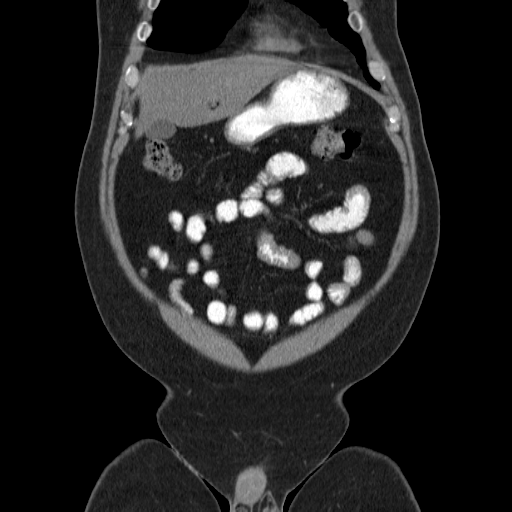
[im 65/147  soft-tissue]
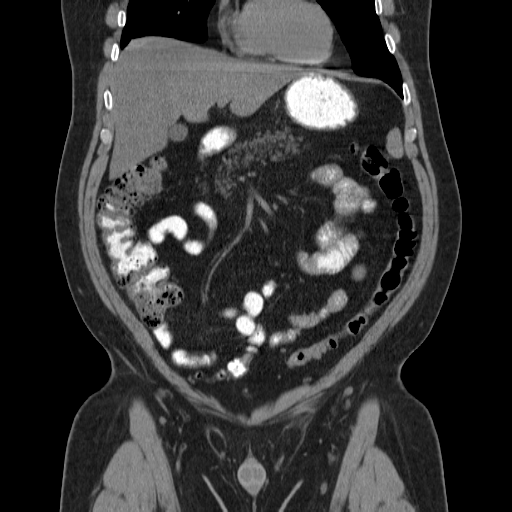
[im 82/147  soft-tissue]
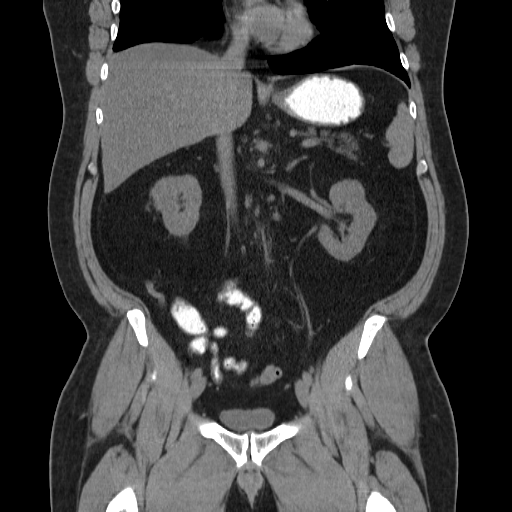

[16 of 46 positions shown; findings below may reference images not displayed]

FINDINGS: The lung bases are clear. The liver is somewhat low in attenuation
which may indicate mild fatty infiltration. No calcified gallstones
are seen. There is fatty infiltration of much of the pancreas. The
pancreatic duct is not dilated. The adrenal glands and spleen are
unremarkable. The stomach is moderately distended with oral
contrast. On this unenhanced study, no renal calculi are seen and
there is no evidence of renal mass. No hydronephrosis is noted. The
proximal ureters are normal in caliber. The abdominal aorta is
normal in caliber. No adenopathy is seen.

The colon is decompressed and there is feces throughout the colon.
The terminal ileum and the appendix are unremarkable. The urinary
bladder is not well distended but no abnormality is seen. The
prostate is normal in size. No pelvic adenopathy is seen. No
abdominal wall hernia is noted. There are degenerative changes
involving the SI joints. The lumbar vertebrae are in normal
alignment. Mild degenerative disc disease is noted at L5-S1. No
compression deformity is seen.
IMPRESSION: 1. No explanation for the patient's right upper and right lower
quadrant pain is seen.
2. The terminal ileum and the appendix are unremarkable.
3. Probable mild fatty infiltration of the liver.  No gallstones.
4. No renal or ureteral calculi are seen.
5. Mild degenerative disc disease at L5-S1

## 2016-08-12 ENCOUNTER — Other Ambulatory Visit: Payer: Self-pay | Admitting: Internal Medicine

## 2016-08-20 ENCOUNTER — Other Ambulatory Visit: Payer: Self-pay | Admitting: *Deleted

## 2016-08-20 ENCOUNTER — Telehealth: Payer: Self-pay | Admitting: Internal Medicine

## 2016-08-20 ENCOUNTER — Encounter: Payer: Self-pay | Admitting: Internal Medicine

## 2016-08-20 MED ORDER — METOPROLOL TARTRATE 50 MG PO TABS: 50.0000 mg | ORAL_TABLET | Freq: Two times a day (BID) | ORAL | 1 refills | Status: DC

## 2016-08-20 NOTE — Telephone Encounter (Signed)
Per patient advise request "Nurse called my wife about my issue of afib having issues. Please call my cell instead of hers at [phone]. Thank you. Minta BalsamErik"   Spoke with the patient.  He states that he had an issue with his stomach about week and a half ago with vomiting. Since that time, he has noticed "flutters" that occur about every few minutes-10 minutes.  When they occur, they last about 3 seconds or so.  He is currently taking metoprolol tartrate 50 mg BID. He last saw Dr. Graciela HusbandsKlein 03/2015. I advised him that we should see him for re-evaluation of symptoms.  He is scheduled to follow up with Dr. Graciela HusbandsKlein on 08/25/16 at 2:00 pm. He is agreeable.

## 2016-08-20 NOTE — Telephone Encounter (Signed)
Left voicemail message to call back  

## 2016-08-20 NOTE — Telephone Encounter (Signed)
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

## 2016-08-25 ENCOUNTER — Ambulatory Visit (INDEPENDENT_AMBULATORY_CARE_PROVIDER_SITE_OTHER): Payer: BLUE CROSS/BLUE SHIELD | Admitting: Internal Medicine

## 2016-08-25 ENCOUNTER — Encounter: Payer: Self-pay | Admitting: Internal Medicine

## 2016-08-25 VITALS — BP 120/80 | HR 77 | Ht 70.0 in | Wt 252.0 lb

## 2016-08-25 DIAGNOSIS — R002 Palpitations: Secondary | ICD-10-CM

## 2016-08-25 DIAGNOSIS — I493 Ventricular premature depolarization: Secondary | ICD-10-CM

## 2016-08-25 DIAGNOSIS — I456 Pre-excitation syndrome: Secondary | ICD-10-CM | POA: Diagnosis not present

## 2016-08-25 NOTE — Patient Instructions (Signed)

## 2016-08-25 NOTE — Progress Notes (Signed)
      Patient Care Team: Tommie Samsook, Jayce G, DO as PCP - General (Family Medicine)   HPI  Eugene Robinson is a 46 y.o. male Seen in follow-up for shortness of breath. 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 Furthermore, palpitations and been associated with PVCs  He tried a variety of beta blockers; metoprolol tartrate as been associated with significant diminution in the frequency of his events i.e. flutters. He has no attributable side effects  He got food poisoning a couple of weeks ago and has had more PVCs of late.  He also reports a hospitalization last year during which  he developed an empyema. He had some kind of abscess in his spine. He is on long-term intravenous antibiotics. This seems to resolved.  Functional status is stable. Energy is returned. He denies chest pain or edema. Shortness of breath is minimal     Past Medical History:  Diagnosis Date  . Chicken pox   . GERD (gastroesophageal reflux disease)   . Kidney stones   . PVC's (premature ventricular contractions) 03/19/2015  . WPW (Wolff-Parkinson-White syndrome)     Past Surgical History:  Procedure Laterality Date  . ADENOIDECTOMY      Current Outpatient Prescriptions  Medication Sig Dispense Refill  . acetaminophen (TYLENOL) 500 MG tablet Take 1,000 mg by mouth every 8 (eight) hours as needed for moderate pain.    . metoprolol (LOPRESSOR) 50 MG tablet Take 1 tablet (50 mg total) by mouth 2 (two) times daily. 60 tablet 1   No current facility-administered medications for this visit.     No Known Allergies    Review of Systems negative except from HPI and PMH  Physical Exam BP 120/80   Pulse 77   Ht 5\' 10"  (1.778 m)   Wt 252 lb (114.3 kg)   SpO2 99%   BMI 36.16 kg/m  Well developed and well nourished in no acute distress HENT normal E scleral and icterus clear Neck Supple JVP flat; carotids brisk and full Clear to  ausculation  Regular rate and rhythm, no murmurs gallops or rub loud s1 Soft with active bowel sounds No clubbing cyanosis   Edema Alert and oriented, grossly normal motor and sensory function Skin Warm and Dry  ECG demonstrates sinus rhythm at 72 Intervals 02/24/40 Ventricular preexcitation associated with midseptal accessory pathway  Assessment and  Plan  WPW  PVCs     We'll continue him on metoprolol    He would like to pursue catheter ablation although he liked to polycystic  Next year. We have again reviewed the potential risks and benefits of catheter ablation as well as from phototherapy for patients with symptomatic ventricular preexcitation. He is aware of the potential risk of death.   We spent more than 50% of our >25 min visit in face to face counseling regarding the above

## 2016-09-10 ENCOUNTER — Ambulatory Visit: Payer: BLUE CROSS/BLUE SHIELD | Admitting: Internal Medicine

## 2016-10-17 ENCOUNTER — Other Ambulatory Visit: Payer: Self-pay | Admitting: Internal Medicine

## 2016-11-24 ENCOUNTER — Telehealth: Payer: Self-pay | Admitting: Internal Medicine

## 2016-11-24 NOTE — Telephone Encounter (Signed)
Pre-Operative Medical Clearance requested from [facility]. They are requesting clearance and recommendations for extraction of 18 teeth under IV sedation. Requested studies, Completed Medical Clearance/ H&P Form, Cardiac stress test, and 12 lead EKG. They request that this clearance and documents be [facility] at phone number [phone] and faxed to [phone]. Will route this request to [facility] and physician.

## 2016-11-24 NOTE — Telephone Encounter (Signed)
PT is having dental surgery in two weeks Needs Medical Clearance form signed Placed in nurse box

## 2016-11-27 NOTE — Telephone Encounter (Signed)
Clearance for extraction of 18 teeth under IV sedation faxed back to [facility] at [phone]. Confirmation received. The patient is aware.

## 2016-11-27 NOTE — Telephone Encounter (Signed)
Have paperwork for Dr. Graciela Husbands to review- he is the only EP covering the hospital and office this week- he is supposed to be back today to see me about his paperwork.

## 2016-11-27 NOTE — Telephone Encounter (Signed)
Spoke with patient and made him aware that I forwarded the information to Dr. Graciela Husbands and Herbert Seta and I am unsure if they have seen this yet. Will route message to Dr. Graciela Husbands and Herbert Seta along with [facility] office to see if anyone is covering. Patient was understanding and had no further questions at this time.

## 2016-11-27 NOTE — Telephone Encounter (Signed)
Pt wife called, states pt needs paperwork completed for clearance by Monday 8/20. She states pt has to get mold done on Monday and will need paperwork  For that.

## 2016-12-26 ENCOUNTER — Encounter: Payer: Self-pay | Admitting: Family Medicine

## 2016-12-28 NOTE — Telephone Encounter (Signed)
Spoke with patients wife Eugene Robinson she just wanted you to know that patient had teeth extracted . Patient is currently on Amoxicillin 500 mg 1 tid.  He will be getting dentures .  Patient will be establishing with Dr Birdie Sons.

## 2017-07-19 ENCOUNTER — Encounter: Payer: Self-pay | Admitting: Emergency Medicine

## 2017-07-19 ENCOUNTER — Emergency Department
Admission: EM | Admit: 2017-07-19 | Discharge: 2017-07-19 | Disposition: A | Discharge status: Home / Self Care | Payer: BLUE CROSS/BLUE SHIELD | Attending: Emergency Medicine | Admitting: Emergency Medicine

## 2017-07-19 ENCOUNTER — Other Ambulatory Visit: Payer: Self-pay

## 2017-07-19 ENCOUNTER — Emergency Department: Payer: BLUE CROSS/BLUE SHIELD

## 2017-07-19 DIAGNOSIS — N200 Calculus of kidney: Secondary | ICD-10-CM | POA: Diagnosis not present

## 2017-07-19 DIAGNOSIS — Z79899 Other long term (current) drug therapy: Secondary | ICD-10-CM | POA: Diagnosis not present

## 2017-07-19 DIAGNOSIS — R109 Unspecified abdominal pain: Secondary | ICD-10-CM | POA: Diagnosis not present

## 2017-07-19 DIAGNOSIS — R1032 Left lower quadrant pain: Secondary | ICD-10-CM | POA: Diagnosis not present

## 2017-07-19 DIAGNOSIS — N201 Calculus of ureter: Secondary | ICD-10-CM | POA: Diagnosis not present

## 2017-07-19 LAB — URINALYSIS, COMPLETE (UACMP) WITH MICROSCOPIC
BILIRUBIN URINE: NEGATIVE
Bacteria, UA: NONE SEEN
GLUCOSE, UA: NEGATIVE mg/dL
KETONES UR: NEGATIVE mg/dL
Leukocytes, UA: NEGATIVE
Nitrite: NEGATIVE
PROTEIN: NEGATIVE mg/dL
Specific Gravity, Urine: 1.023 (ref 1.005–1.030)
pH: 5 (ref 5.0–8.0)

## 2017-07-19 LAB — BASIC METABOLIC PANEL
ANION GAP: 8 (ref 5–15)
BUN: 11 mg/dL (ref 6–20)
CALCIUM: 8.6 mg/dL — AB (ref 8.9–10.3)
CO2: 23 mmol/L (ref 22–32)
CREATININE: 1.11 mg/dL (ref 0.61–1.24)
Chloride: 107 mmol/L (ref 101–111)
GFR calc Af Amer: >60 mL/min (ref >60)
Glucose, Bld: 143 mg/dL — ABNORMAL HIGH (ref 65–99)
Potassium: 4.1 mmol/L (ref 3.5–5.1)
Sodium: 138 mmol/L (ref 135–145)

## 2017-07-19 LAB — CBC
HEMATOCRIT: 45.2 % (ref 40.0–52.0)
Hemoglobin: 15.1 g/dL (ref 13.0–18.0)
MCH: 28.3 pg (ref 26.0–34.0)
MCHC: 33.3 g/dL (ref 32.0–36.0)
MCV: 84.8 fL (ref 80.0–100.0)
Platelets: 229 10*3/uL (ref 150–440)
RBC: 5.33 MIL/uL (ref 4.40–5.90)
RDW: 13.8 % (ref 11.5–14.5)
WBC: 6.9 10*3/uL (ref 3.8–10.6)

## 2017-07-19 MED ORDER — SODIUM CHLORIDE 0.9 % IV BOLUS
1000.0000 mL | Freq: Once | INTRAVENOUS | Status: AC
  Administered 2017-07-19: 1000 mL via INTRAVENOUS

## 2017-07-19 MED ORDER — ONDANSETRON HCL 4 MG/2ML IJ SOLN
4.0000 mg | Freq: Once | INTRAMUSCULAR | Status: AC
  Administered 2017-07-19: 4 mg via INTRAVENOUS
  Filled 2017-07-19: qty 2

## 2017-07-19 MED ORDER — KETOROLAC TROMETHAMINE 30 MG/ML IJ SOLN
15.0000 mg | Freq: Once | INTRAMUSCULAR | Status: AC
  Administered 2017-07-19: 15 mg via INTRAVENOUS
  Filled 2017-07-19: qty 1

## 2017-07-19 MED ORDER — ONDANSETRON 4 MG PO TBDP: 4.0000 mg | ORAL_TABLET | Freq: Three times a day (TID) | ORAL | 0 refills | Status: DC | PRN

## 2017-07-19 MED ORDER — FENTANYL CITRATE (PF) 100 MCG/2ML IJ SOLN
50.0000 ug | INTRAMUSCULAR | Status: DC | PRN
  Administered 2017-07-19: 50 ug via INTRAVENOUS
  Filled 2017-07-19: qty 2

## 2017-07-19 MED ORDER — KETOROLAC TROMETHAMINE 30 MG/ML IJ SOLN
15.0000 mg | Freq: Once | INTRAMUSCULAR | Status: AC
  Administered 2017-07-19: 15 mg via INTRAVENOUS

## 2017-07-19 MED ORDER — OXYCODONE-ACETAMINOPHEN 5-325 MG PO TABS: 1.0000 | ORAL_TABLET | ORAL | 0 refills | Status: DC | PRN

## 2017-07-19 MED ORDER — TAMSULOSIN HCL 0.4 MG PO CAPS: 0.4000 mg | ORAL_CAPSULE | Freq: Every day | ORAL | 0 refills | Status: DC

## 2017-07-19 MED ORDER — KETOROLAC TROMETHAMINE 30 MG/ML IJ SOLN
INTRAMUSCULAR | Status: AC
  Administered 2017-07-19: 15 mg via INTRAVENOUS
  Filled 2017-07-19: qty 1

## 2017-07-19 NOTE — ED Triage Notes (Signed)
Here for left flank pain. Vomiting at check in.  Started this morning.  No history kidney stones.

## 2017-07-19 NOTE — Discharge Instructions (Signed)
Return to the emergency department for any worsening abdominal pain, fever, painful urination, or any other symptom personally concerning to yourself.

## 2017-07-19 NOTE — ED Provider Notes (Signed)
[facility] Emergency Department Provider Note  Time seen: 8:02 AM  I have reviewed the triage vital signs and the nursing notes.   HISTORY  Chief Complaint Flank Pain    HPI Eugene Robinson is a 47 y.o. male with a past medical history of gastric reflux, presents to the emergency department for left flank pain.  According to the patient approximately 30 minutes ago while driving his daughter to school he developed sudden severe sharp left flank pain.  States no history of kidney stones previously but his wife who has had 3 kidney stones suggested that is what he could be.  States he urinated this morning it was normal denies dysuria or hematuria.  Normal bowel movement today, no black or bloody stool.  No fever, largely negative review of systems otherwise.  Patient currently rates his pain as an 8/10 approximately 10 minutes after receiving fentanyl in the emergency department.   Past Medical History:  Diagnosis Date  . Chicken pox   . GERD (gastroesophageal reflux disease)   . Kidney stones   . PVC's (premature ventricular contractions) 03/19/2015  . WPW (Wolff-Parkinson-White syndrome)     Patient Active Problem List   Diagnosis Date Noted  . Vertebral osteomyelitis (HCC) 07/17/2015  . GERD (gastroesophageal reflux disease) 07/02/2015  . WPW (Wolff-Parkinson-White syndrome) 11/07/2014  . Obesity (BMI 35.0-39.9 without comorbidity) 11/07/2014  . Preventative health care 11/07/2014    Past Surgical History:  Procedure Laterality Date  . ADENOIDECTOMY      Prior to Admission medications   Medication Sig Start Date End Date Taking? Authorizing Provider  acetaminophen (TYLENOL) 500 MG tablet Take 1,000 mg by mouth every 8 (eight) hours as needed for moderate pain.    [provider]  metoprolol tartrate (LOPRESSOR) 50 MG tablet TAKE 1 TABLET BY MOUTH TWICE A DAY 10/19/16   Duke Salvia, MD    No Known Allergies  Family History  Problem  Relation Age of Onset  . Cancer Mother        lung and breast  . Parkinson's disease Mother     Social History Social History   Tobacco Use  . Smoking status: Never Smoker  . Smokeless tobacco: Never Used  Substance Use Topics  . Alcohol use: No    Alcohol/week: 0.0 oz  . Drug use: No    Review of Systems Constitutional: Negative for fever. Eyes: Negative for visual complaints ENT: Negative for recent illness/congestion Cardiovascular: Negative for chest pain. Respiratory: Negative for shortness of breath. Gastrointestinal: Left flank pain, 8/10, sharp.  Positive for nausea and vomiting.  Negative for diarrhea. Genitourinary: Negative for dysuria or hematuria Musculoskeletal: Negative for musculoskeletal complaints Skin: Negative for skin complaints  Neurological: Negative for headache All other ROS negative  ____________________________________________   PHYSICAL EXAM:  VITAL SIGNS: ED Triage Vitals  Enc Vitals Group     BP 07/19/17 0743 (!) 118/59     Pulse Rate 07/19/17 0743 (!) 56     Resp 07/19/17 0743 20     Temp 07/19/17 0743 97.7 F (36.5 C)     Temp Source 07/19/17 0743 Oral     SpO2 07/19/17 0743 97 %     Weight 07/19/17 0737 245 lb (111.1 kg)     Height 07/19/17 0737 5\' 10"  (1.778 m)     Head Circumference --      Peak Flow --      Pain Score 07/19/17 0737 10     Pain Loc --  Pain Edu? --      Excl. in GC? --     Constitutional: Alert and oriented. Well appearing and in no distress. Eyes: Normal exam ENT   Head: Normocephalic and atraumatic.   Mouth/Throat: Mucous membranes are moist. Cardiovascular: Normal rate, regular rhythm. No murmur Respiratory: Normal respiratory effort without tachypnea nor retractions. Breath sounds are clear Gastrointestinal: Soft, slight left lower quadrant tenderness to palpation.  No rebound or guarding.  No distention.  Mild CVA tenderness on the left. Musculoskeletal: Nontender with normal range of  motion in all extremities Neurologic:  Normal speech and language. No gross focal neurologic deficits  Skin:  Skin is warm, dry and intact.  Psychiatric: Mood and affect are normal.   ____________________________________________   RADIOLOGY  2 mm left UVJ stone.  ____________________________________________   INITIAL IMPRESSION / ASSESSMENT AND PLAN / ED COURSE  Pertinent labs & imaging results that were available during my care of the patient were reviewed by me and considered in my medical decision making (see chart for details).  Patient presents emergency department for left flank pain beginning approxi-30 minutes prior to arrival.  States severe sharp pain.  Differential would include ureterolithiasis, diverticulitis, colitis, kidney stone.  We will check labs, obtain a CT renal scan to further evaluate.  Patient received fentanyl in triage with 8/10 pain currently, we will dose Toradol, IV fluids and continue to closely monitor.  Patient agreeable to this plan of care.  CT consistent with left ureteral stone.  Patient states his pain increased but is now improving once again.  We will discharge the patient with Percocet, Flomax, urine strainer with urology follow-up.  Patient agreeable to this plan of care.  ____________________________________________   FINAL CLINICAL IMPRESSION(S) / ED DIAGNOSES  Left flank pain Kidney stone   Minna AntisPaduchowski, Pranit Owensby, MD 07/19/17 1102

## 2017-07-19 NOTE — ED Notes (Signed)
Pt c/o sudden onset left flank pain that started while driving his daughter to school this morning with N/V.the patient is in NAD at present, was given pain meds in triage.. Denies a hx of kidney stones in the past..

## 2017-07-19 NOTE — ED Notes (Signed)
Patient transported to CT 

## 2017-07-20 LAB — URINE CULTURE

## 2017-07-27 ENCOUNTER — Ambulatory Visit: Payer: BLUE CROSS/BLUE SHIELD | Admitting: Urology

## 2017-07-28 DIAGNOSIS — J069 Acute upper respiratory infection, unspecified: Secondary | ICD-10-CM | POA: Diagnosis not present

## 2017-08-18 ENCOUNTER — Other Ambulatory Visit: Payer: Self-pay

## 2017-08-18 MED ORDER — METOPROLOL TARTRATE 50 MG PO TABS: 50.0000 mg | ORAL_TABLET | Freq: Two times a day (BID) | ORAL | 0 refills | Status: DC

## 2017-09-09 ENCOUNTER — Ambulatory Visit: Payer: BLUE CROSS/BLUE SHIELD | Admitting: Internal Medicine

## 2017-09-09 ENCOUNTER — Encounter: Payer: Self-pay | Admitting: Internal Medicine

## 2017-09-09 VITALS — BP 110/78 | HR 59 | Ht 70.0 in | Wt 244.0 lb

## 2017-09-09 DIAGNOSIS — I456 Pre-excitation syndrome: Secondary | ICD-10-CM | POA: Diagnosis not present

## 2017-09-09 NOTE — Patient Instructions (Signed)
Medication Instructions:  Your physician recommends that you continue on your current medications as directed. Please refer to the Current Medication list given to you today.   Labwork: none  Testing/Procedures: none  Follow-Up: Your physician wants you to follow-up in: 2 YEARS WITH DR Graciela Husbands. You will receive a reminder letter in the mail two months in advance. If you don't receive a letter, please call our office to schedule the follow-up appointment.

## 2017-09-09 NOTE — Progress Notes (Signed)
      Patient Care Team: System, Pcp Not In as PCP - General   HPI  Eugene Robinson is a 47 y.o. male Seen in follow-up for shortness of breath. 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 Furthermore, palpitations and been associated with PVCs  He tried a variety of beta blockers; metoprolol tartrate as been associated with significant diminution in the frequency of his events i.e. flutters. He has no attributable side effects  He got food poisoning a couple of weeks ago and has had more PVCs of late.  Hospitalization 2016 during which  he developed an empyema. He had some kind of abscess in his spine rx with long-term intravenous antibiotics.   He and his wife are parents of his adopted foster daughter-DOB 2013; she has made them exceedingly more active.  The patient denies chest pain, shortness of breath, nocturnal dyspnea, orthopnea or peripheral edema.  There have been no palpitations, lightheadedness or syncope.     Past Medical History:  Diagnosis Date  . Chicken pox   . GERD (gastroesophageal reflux disease)   . Kidney stones   . PVC's (premature ventricular contractions) 03/19/2015  . WPW (Wolff-Parkinson-White syndrome)     Past Surgical History:  Procedure Laterality Date  . ADENOIDECTOMY      Current Outpatient Medications  Medication Sig Dispense Refill  . metoprolol tartrate (LOPRESSOR) 50 MG tablet Take 1 tablet (50 mg total) by mouth 2 (two) times daily. 60 tablet 0   No current facility-administered medications for this visit.     No Known Allergies    Review of Systems negative except from HPI and PMH  Physical Exam BP 110/78 (BP Location: Left Arm, Patient Position: Sitting, Cuff Size: Normal)   Pulse (!) 59   Ht  (1.778 m)   Wt 244 lb (110.7 kg)   BMI 35.01 kg/m  Well developed and nourished in no acute distress HENT normal Neck supple with JVP-flat Clear Regular rate  and rhythm, no murmurs or gallops Abd-soft with active BS No Clubbing cyanosis edema Skin-warm and dry A & Oriented  Grossly normal sensory and motor function   ECG demonstrates sinus rhythm with ventricular preexcitation  Assessment and  Plan  WPW  PVCs     PVCs and SVT are quiesced sent on the metoprolol without discernible side effects  Continue current medications

## 2017-09-17 ENCOUNTER — Other Ambulatory Visit: Payer: Self-pay | Admitting: Internal Medicine

## 2017-09-17 NOTE — Telephone Encounter (Signed)
This is a [place] pt 

## 2017-10-04 DIAGNOSIS — F4322 Adjustment disorder with anxiety: Secondary | ICD-10-CM | POA: Diagnosis not present

## 2017-10-20 DIAGNOSIS — F4322 Adjustment disorder with anxiety: Secondary | ICD-10-CM | POA: Diagnosis not present

## 2017-11-05 DIAGNOSIS — F4322 Adjustment disorder with anxiety: Secondary | ICD-10-CM | POA: Diagnosis not present

## 2017-11-22 DIAGNOSIS — F4322 Adjustment disorder with anxiety: Secondary | ICD-10-CM | POA: Diagnosis not present

## 2017-12-03 DIAGNOSIS — F4322 Adjustment disorder with anxiety: Secondary | ICD-10-CM | POA: Diagnosis not present

## 2017-12-08 DIAGNOSIS — M9901 Segmental and somatic dysfunction of cervical region: Secondary | ICD-10-CM | POA: Diagnosis not present

## 2017-12-08 DIAGNOSIS — M62830 Muscle spasm of back: Secondary | ICD-10-CM | POA: Diagnosis not present

## 2017-12-08 DIAGNOSIS — R293 Abnormal posture: Secondary | ICD-10-CM | POA: Diagnosis not present

## 2017-12-08 DIAGNOSIS — M9902 Segmental and somatic dysfunction of thoracic region: Secondary | ICD-10-CM | POA: Diagnosis not present

## 2017-12-09 DIAGNOSIS — M9901 Segmental and somatic dysfunction of cervical region: Secondary | ICD-10-CM | POA: Diagnosis not present

## 2017-12-09 DIAGNOSIS — M9902 Segmental and somatic dysfunction of thoracic region: Secondary | ICD-10-CM | POA: Diagnosis not present

## 2017-12-09 DIAGNOSIS — M62830 Muscle spasm of back: Secondary | ICD-10-CM | POA: Diagnosis not present

## 2017-12-09 DIAGNOSIS — R293 Abnormal posture: Secondary | ICD-10-CM | POA: Diagnosis not present

## 2017-12-15 DIAGNOSIS — M9902 Segmental and somatic dysfunction of thoracic region: Secondary | ICD-10-CM | POA: Diagnosis not present

## 2017-12-15 DIAGNOSIS — M9901 Segmental and somatic dysfunction of cervical region: Secondary | ICD-10-CM | POA: Diagnosis not present

## 2017-12-15 DIAGNOSIS — M62830 Muscle spasm of back: Secondary | ICD-10-CM | POA: Diagnosis not present

## 2017-12-15 DIAGNOSIS — R293 Abnormal posture: Secondary | ICD-10-CM | POA: Diagnosis not present

## 2017-12-16 DIAGNOSIS — M62830 Muscle spasm of back: Secondary | ICD-10-CM | POA: Diagnosis not present

## 2017-12-16 DIAGNOSIS — M9901 Segmental and somatic dysfunction of cervical region: Secondary | ICD-10-CM | POA: Diagnosis not present

## 2017-12-16 DIAGNOSIS — R293 Abnormal posture: Secondary | ICD-10-CM | POA: Diagnosis not present

## 2017-12-16 DIAGNOSIS — F4322 Adjustment disorder with anxiety: Secondary | ICD-10-CM | POA: Diagnosis not present

## 2017-12-16 DIAGNOSIS — M9902 Segmental and somatic dysfunction of thoracic region: Secondary | ICD-10-CM | POA: Diagnosis not present

## 2017-12-20 DIAGNOSIS — R293 Abnormal posture: Secondary | ICD-10-CM | POA: Diagnosis not present

## 2017-12-20 DIAGNOSIS — M9901 Segmental and somatic dysfunction of cervical region: Secondary | ICD-10-CM | POA: Diagnosis not present

## 2017-12-20 DIAGNOSIS — M62830 Muscle spasm of back: Secondary | ICD-10-CM | POA: Diagnosis not present

## 2017-12-20 DIAGNOSIS — M62451 Contracture of muscle, right thigh: Secondary | ICD-10-CM | POA: Diagnosis not present

## 2017-12-20 DIAGNOSIS — M9906 Segmental and somatic dysfunction of lower extremity: Secondary | ICD-10-CM | POA: Diagnosis not present

## 2017-12-20 DIAGNOSIS — M9902 Segmental and somatic dysfunction of thoracic region: Secondary | ICD-10-CM | POA: Diagnosis not present

## 2017-12-20 DIAGNOSIS — M62452 Contracture of muscle, left thigh: Secondary | ICD-10-CM | POA: Diagnosis not present

## 2017-12-21 DIAGNOSIS — R293 Abnormal posture: Secondary | ICD-10-CM | POA: Diagnosis not present

## 2017-12-21 DIAGNOSIS — M62830 Muscle spasm of back: Secondary | ICD-10-CM | POA: Diagnosis not present

## 2017-12-21 DIAGNOSIS — M9906 Segmental and somatic dysfunction of lower extremity: Secondary | ICD-10-CM | POA: Diagnosis not present

## 2017-12-21 DIAGNOSIS — M62452 Contracture of muscle, left thigh: Secondary | ICD-10-CM | POA: Diagnosis not present

## 2017-12-21 DIAGNOSIS — M62451 Contracture of muscle, right thigh: Secondary | ICD-10-CM | POA: Diagnosis not present

## 2017-12-21 DIAGNOSIS — M9902 Segmental and somatic dysfunction of thoracic region: Secondary | ICD-10-CM | POA: Diagnosis not present

## 2017-12-21 DIAGNOSIS — M9901 Segmental and somatic dysfunction of cervical region: Secondary | ICD-10-CM | POA: Diagnosis not present

## 2017-12-27 DIAGNOSIS — M9901 Segmental and somatic dysfunction of cervical region: Secondary | ICD-10-CM | POA: Diagnosis not present

## 2017-12-27 DIAGNOSIS — R293 Abnormal posture: Secondary | ICD-10-CM | POA: Diagnosis not present

## 2017-12-27 DIAGNOSIS — M62451 Contracture of muscle, right thigh: Secondary | ICD-10-CM | POA: Diagnosis not present

## 2017-12-27 DIAGNOSIS — M62452 Contracture of muscle, left thigh: Secondary | ICD-10-CM | POA: Diagnosis not present

## 2017-12-27 DIAGNOSIS — M62830 Muscle spasm of back: Secondary | ICD-10-CM | POA: Diagnosis not present

## 2017-12-27 DIAGNOSIS — M9902 Segmental and somatic dysfunction of thoracic region: Secondary | ICD-10-CM | POA: Diagnosis not present

## 2017-12-27 DIAGNOSIS — M9906 Segmental and somatic dysfunction of lower extremity: Secondary | ICD-10-CM | POA: Diagnosis not present

## 2018-01-19 DIAGNOSIS — F4322 Adjustment disorder with anxiety: Secondary | ICD-10-CM | POA: Diagnosis not present

## 2018-01-31 DIAGNOSIS — F4322 Adjustment disorder with anxiety: Secondary | ICD-10-CM | POA: Diagnosis not present

## 2018-02-28 DIAGNOSIS — F4322 Adjustment disorder with anxiety: Secondary | ICD-10-CM | POA: Diagnosis not present

## 2018-03-14 DIAGNOSIS — F4322 Adjustment disorder with anxiety: Secondary | ICD-10-CM | POA: Diagnosis not present

## 2018-03-21 DIAGNOSIS — F4322 Adjustment disorder with anxiety: Secondary | ICD-10-CM | POA: Diagnosis not present

## 2018-03-29 ENCOUNTER — Ambulatory Visit: Payer: BLUE CROSS/BLUE SHIELD | Admitting: Family Medicine

## 2018-03-29 ENCOUNTER — Encounter: Payer: Self-pay | Admitting: Family Medicine

## 2018-03-29 VITALS — BP 110/68 | HR 71 | Temp 97.8°F | Ht 69.5 in | Wt 243.4 lb

## 2018-03-29 DIAGNOSIS — Z125 Encounter for screening for malignant neoplasm of prostate: Secondary | ICD-10-CM

## 2018-03-29 DIAGNOSIS — Z23 Encounter for immunization: Secondary | ICD-10-CM

## 2018-03-29 DIAGNOSIS — I456 Pre-excitation syndrome: Secondary | ICD-10-CM

## 2018-03-29 DIAGNOSIS — F329 Major depressive disorder, single episode, unspecified: Secondary | ICD-10-CM

## 2018-03-29 DIAGNOSIS — F419 Anxiety disorder, unspecified: Secondary | ICD-10-CM

## 2018-03-29 DIAGNOSIS — F4322 Adjustment disorder with anxiety: Secondary | ICD-10-CM | POA: Diagnosis not present

## 2018-03-29 LAB — COMPREHENSIVE METABOLIC PANEL
ALBUMIN: 4.6 g/dL (ref 3.5–5.2)
ALK PHOS: 128 U/L — AB (ref 39–117)
ALT: 21 U/L (ref 0–53)
AST: 20 U/L (ref 0–37)
BUN: 12 mg/dL (ref 6–23)
CALCIUM: 9.3 mg/dL (ref 8.4–10.5)
CHLORIDE: 102 meq/L (ref 96–112)
CO2: 30 mEq/L (ref 19–32)
Creatinine, Ser: 1.12 mg/dL (ref 0.40–1.50)
GFR: 73.93 mL/min (ref >60.00)
Glucose, Bld: 86 mg/dL (ref 70–99)
POTASSIUM: 4.8 meq/L (ref 3.5–5.1)
SODIUM: 138 meq/L (ref 135–145)
TOTAL PROTEIN: 7.6 g/dL (ref 6.0–8.3)
Total Bilirubin: 1.1 mg/dL (ref 0.2–1.2)

## 2018-03-29 LAB — CBC
HEMATOCRIT: 46.6 % (ref 39.0–52.0)
HEMOGLOBIN: 16 g/dL (ref 13.0–17.0)
MCHC: 34.3 g/dL (ref 30.0–36.0)
MCV: 84.2 fl (ref 78.0–100.0)
PLATELETS: 238 10*3/uL (ref 150.0–400.0)
RBC: 5.53 Mil/uL (ref 4.22–5.81)
RDW: 13.1 % (ref 11.5–15.5)
WBC: 7.4 10*3/uL (ref 4.0–10.5)

## 2018-03-29 LAB — LIPID PANEL
CHOLESTEROL: 161 mg/dL (ref 0–200)
HDL: 27.1 mg/dL — ABNORMAL LOW (ref >39.00)
LDL Cholesterol: 106 mg/dL — ABNORMAL HIGH (ref 0–99)
NonHDL: 133.55
TRIGLYCERIDES: 140 mg/dL (ref 0.0–149.0)
Total CHOL/HDL Ratio: 6
VLDL: 28 mg/dL (ref 0.0–40.0)

## 2018-03-29 LAB — B12 AND FOLATE PANEL
Folate: 10.5 ng/mL (ref >5.9)
Vitamin B-12: 314 pg/mL (ref 211–911)

## 2018-03-29 LAB — VITAMIN D 25 HYDROXY (VIT D DEFICIENCY, FRACTURES): VITD: 24.86 ng/mL — AB (ref 30.00–100.00)

## 2018-03-29 LAB — PSA: PSA: 0.8 ng/mL (ref 0.10–4.00)

## 2018-03-29 MED ORDER — ESCITALOPRAM OXALATE 10 MG PO TABS: 10.0000 mg | ORAL_TABLET | Freq: Every day | ORAL | 2 refills | Status: DC

## 2018-03-29 NOTE — Progress Notes (Signed)
Subjective:    Patient ID: Eugene Robinson, male    DOB: [birth year], 47 y.o.   MRN: [id number]  HPI  Presents to clinic as a TOC from Dr Adriana Simasook.  Patient has a history of Wolff-Parkinson-White syndrome, he is followed by Dr. Graciela HusbandsKlein at cardiology.  He takes metoprolol for rate control.  He feels well on this medication.  Patient reports that over the past many months he is felt more down and also has outbursts of anger.  Patient states he and wife have been seeing a marriage counselor, and the counselor did encourage him as well as his wife to come in and be seen for evaluation and possibly get on a medication to help control mood.  Patient states ever since he had a severe osteomyelitis infection of vertebrae in 2017, it seems that something changed within him.  States he wants to be happy and wants to be able to express himself, but is having a hard time doing so and everyone around him always thinks he is upset or sad.  Patient denies any SI or HI.  Past medical history, family history, social history, surgical history reviewed and updated accordingly in chart. Patient Active Problem List   Diagnosis Date Noted  . Vertebral osteomyelitis (HCC) 07/17/2015  . GERD (gastroesophageal reflux disease) 07/02/2015  . WPW (Wolff-Parkinson-White syndrome) 11/07/2014  . Obesity (BMI 35.0-39.9 without comorbidity) 11/07/2014  . Preventative health care 11/07/2014   Past Medical History:  Diagnosis Date  . Chicken pox   . GERD (gastroesophageal reflux disease)   . Kidney stones   . PVC's (premature ventricular contractions) 03/19/2015  . WPW (Wolff-Parkinson-White syndrome)    Past Surgical History:  Procedure Laterality Date  . ADENOIDECTOMY     Social History   Tobacco Use  . Smoking status: Never Smoker  . Smokeless tobacco: Never Used  Substance Use Topics  . Alcohol use: No    Alcohol/week: 0.0 standard drinks   Family History  Problem Relation Age of Onset  . Cancer Mother    lung and breast  . Parkinson's disease Mother     Review of Systems  Constitutional: Negative for chills, fatigue and fever.  HENT: Negative for congestion, ear pain, sinus pain and sore throat.   Eyes: Negative.   Respiratory: Negative for cough, shortness of breath and wheezing.   Cardiovascular: Negative for chest pain, palpitations and leg swelling.  Gastrointestinal: Negative for abdominal pain, diarrhea, nausea and vomiting.  Genitourinary: Negative for dysuria, frequency and urgency.  Musculoskeletal: Negative for arthralgias and myalgias.  Skin: Negative for color change, pallor and rash.  Neurological: Negative for syncope, light-headedness and headaches.  Psychiatric/Behavioral: The patient is nervous/anxious, feels down.       Objective:   Physical Exam   Constitutional: He appears well-developed and well-nourished. No distress.  HENT:  Head: Normocephalic and atraumatic.  Eyes: Pupils are equal, round, and reactive to light. Conjunctivae and EOM are normal. No scleral icterus.  Neck: Normal range of motion. Neck supple. No tracheal deviation present.  Cardiovascular: Normal rate, regular rhythm and normal heart sounds.  Pulmonary/Chest: Effort normal and breath sounds normal. No respiratory distress. He has no wheezes. He has no rales.  Neurological: He is alert and oriented to person, place, and time.  Gait normal  Skin: Skin is warm and dry. He is not diaphoretic. No pallor.  Psychiatric: He has a normal mood and affect. His behavior is normal. Thought content normal.  Patient has good eye contact.  He is able to clearly express thoughts.  Nursing note and vitals reviewed.    BP Readings from Last 3 Encounters:  09/09/17 110/78  07/19/17 125/72  08/25/16 120/80   Vitals:   03/29/18 0959  BP: 110/68  Pulse: 71  Temp: 97.8 F (36.6 C)  SpO2: 95%   Assessment & Plan:   Wolff-Parkinson-White syndrome - patient's rate is well controlled on metoprolol.  His  blood pressure remained stable.  Anxiety and depression- patient is agreeable to starting medication once daily.  He will do Lexapro 10 mg/day.  He also will continue seeing the counselor as he has been with his wife.   Patient will get lab work in clinic today including CBC, CMP, thyroid, lipid panel, vitamin D, B12/folate, PSA.  Flu vaccine given in clinic today.  Patient will follow-up in approximately 4 weeks for recheck on mood after adding on Lexapro.  Patient aware he can return to clinic sooner if any issues arise.

## 2018-03-30 LAB — THYROID PANEL WITH TSH
Free Thyroxine Index: 2.2 (ref 1.4–3.8)
T3 UPTAKE: 27 % (ref 22–35)
T4, Total: 8 ug/dL (ref 4.9–10.5)
TSH: 0.88 mIU/L (ref 0.40–4.50)

## 2018-04-22 ENCOUNTER — Telehealth: Payer: Self-pay

## 2018-04-22 ENCOUNTER — Other Ambulatory Visit: Payer: Self-pay

## 2018-04-22 MED ORDER — METOPROLOL TARTRATE 50 MG PO TABS: 50.0000 mg | ORAL_TABLET | Freq: Two times a day (BID) | ORAL | 3 refills | Status: DC

## 2018-04-22 NOTE — Telephone Encounter (Signed)
-----   Message from [name], [place] sent at 04/22/2018  3:07 PM EST ----- Regarding: appointment for refills Patient needs an appointment for further refills. If patient does not want to schedule an appointment please make them aware to contact PCP for refills. I have sent in enough medication until appointment can be made.   Thanks Ladies!   Patient needs an appointment with Graciela Husbands

## 2018-04-22 NOTE — Telephone Encounter (Signed)
No ans no vm   °

## 2018-04-22 NOTE — Telephone Encounter (Signed)
Requested Prescriptions   Signed Prescriptions Disp Refills  . metoprolol tartrate (LOPRESSOR) 50 MG tablet 180 tablet 3    Sig: Take 1 tablet (50 mg total) by mouth 2 (two) times daily.    Authorizing Provider: Duke Salvia    Ordering User: [name]

## 2018-04-25 ENCOUNTER — Other Ambulatory Visit: Payer: Self-pay | Admitting: Lab

## 2018-04-25 DIAGNOSIS — F419 Anxiety disorder, unspecified: Principal | ICD-10-CM

## 2018-04-25 DIAGNOSIS — F32A Depression, unspecified: Secondary | ICD-10-CM

## 2018-04-25 DIAGNOSIS — F329 Major depressive disorder, single episode, unspecified: Secondary | ICD-10-CM

## 2018-04-25 MED ORDER — METOPROLOL TARTRATE 50 MG PO TABS: 50.0000 mg | ORAL_TABLET | Freq: Two times a day (BID) | ORAL | 3 refills | Status: DC

## 2018-04-25 MED ORDER — ESCITALOPRAM OXALATE 10 MG PO TABS: 10.0000 mg | ORAL_TABLET | Freq: Every day | ORAL | 4 refills | Status: DC

## 2018-04-25 MED ORDER — ESCITALOPRAM OXALATE 10 MG PO TABS: 10.0000 mg | ORAL_TABLET | Freq: Every day | ORAL | 1 refills | Status: DC

## 2018-04-25 NOTE — Telephone Encounter (Signed)
[facility] 90 day supply with 4 refills

## 2018-04-25 NOTE — Telephone Encounter (Signed)
There is no Rx request pended - not sure if you did this right in the system

## 2018-04-25 NOTE — Addendum Note (Signed)
Addended by: Clearnce Sorrel on: 04/25/2018 11:40 AM   Modules accepted: Orders

## 2018-04-26 ENCOUNTER — Ambulatory Visit: Payer: BLUE CROSS/BLUE SHIELD | Admitting: Family Medicine

## 2018-04-26 ENCOUNTER — Encounter: Payer: Self-pay | Admitting: Family Medicine

## 2018-04-26 VITALS — BP 112/70 | HR 67 | Temp 97.5°F | Resp 16 | Ht 72.0 in | Wt 243.8 lb

## 2018-04-26 DIAGNOSIS — F329 Major depressive disorder, single episode, unspecified: Secondary | ICD-10-CM | POA: Diagnosis not present

## 2018-04-26 DIAGNOSIS — F419 Anxiety disorder, unspecified: Secondary | ICD-10-CM

## 2018-04-26 NOTE — Progress Notes (Signed)
Subjective:    Patient ID: Eugene Robinson, male    DOB: 08/08/1970, 48 y.o.   MRN: [id number]  HPI   Patient presents to clinic for follow-up on mood after beginning Lexapro 10 mg once per day.  Patient states he is feeling very well on this dose.  He and his wife especially have noticed over the past week and a half, he feels more happy, and has not had any outbursts of anger.  Patient denies any adverse side effects related to medicine, states he over feels well, continues with appetite changes or stomach pain.  Denies SI or HI  Again reviewed lab work with patient.  Overall lab work looked very well except for a slightly elevated lipids and a low vitamin D.   Patient Active Problem List   Diagnosis Date Noted  . Vertebral osteomyelitis (HCC) 07/17/2015  . GERD (gastroesophageal reflux disease) 07/02/2015  . WPW (Wolff-Parkinson-White syndrome) 11/07/2014  . Obesity (BMI 35.0-39.9 without comorbidity) 11/07/2014  . Preventative health care 11/07/2014   Social History   Tobacco Use  . Smoking status: Never Smoker  . Smokeless tobacco: Never Used  Substance Use Topics  . Alcohol use: No    Alcohol/week: 0.0 standard drinks    Review of Systems  Constitutional: Negative for chills, fatigue and fever.  HENT: Negative for congestion, ear pain, sinus pain and sore throat.   Eyes: Negative.   Respiratory: Negative for cough, shortness of breath and wheezing.   Cardiovascular: Negative for chest pain, palpitations and leg swelling.  Gastrointestinal: Negative for abdominal pain, diarrhea, nausea and vomiting.  Genitourinary: Negative for dysuria, frequency and urgency.  Musculoskeletal: Negative for arthralgias and myalgias.  Skin: Negative for color change, pallor and rash.  Neurological: Negative for syncope, light-headedness and headaches.  Psychiatric/Behavioral: The patient is not nervous/anxious.       Objective:   Physical Exam  Constitutional: He appears  well-developed and well-nourished. No distress.  HENT:  Head: Normocephalic and atraumatic.  Eyes: Pupils are equal, round, and reactive to light. Conjunctivae and EOM are normal. No scleral icterus.  Neck: Normal range of motion. Neck supple. No tracheal deviation present.  Cardiovascular: Normal rate, regular rhythm and normal heart sounds.  Pulmonary/Chest: Effort normal and breath sounds normal. No respiratory distress. He has no wheezes. He has no rales.  Neurological: He is alert and oriented to person, place, and time.  Gait normal  Skin: Skin is warm and dry. He is not diaphoretic. No pallor.  Psychiatric: He has a normal mood and affect. His behavior is normal.   Nursing note and vitals reviewed.   Vitals:   04/26/18 1309  BP: 112/70  Pulse: 67  Resp: 16  Temp: (!) 97.5 F (36.4 C)  SpO2: 95%      Assessment & Plan:   Anxiety and depression - patient feels well on current Lexapro dose.  He will continue 10 mg once per day.  Refill was sent yesterday to patient's mail away pharmacy.  Hyperlipidemia -- lipids were slightly elevated and blood work done in December.  Patient will work on [employer] and exercise.  We will plan to recheck lipid panel at next visit.  Vitamin D deficiency - patient is taking vitamin D3 2000 units once per day.  We will plan to recheck vitamin D level at next visit.   Patient will follow-up here in approximately 3 months for recheck on mood.  He is aware he can return to clinic sooner if any  issues arise.

## 2018-04-26 NOTE — Patient Instructions (Signed)
Great to see you!  Continue lexapro and let us know if you need anything

## 2018-05-11 DIAGNOSIS — F4322 Adjustment disorder with anxiety: Secondary | ICD-10-CM | POA: Diagnosis not present

## 2018-05-19 NOTE — Telephone Encounter (Signed)
Per recall patient has a 2 yr fu due in 2021.

## 2018-06-01 DIAGNOSIS — F4322 Adjustment disorder with anxiety: Secondary | ICD-10-CM | POA: Diagnosis not present

## 2018-06-22 DIAGNOSIS — F4322 Adjustment disorder with anxiety: Secondary | ICD-10-CM | POA: Diagnosis not present

## 2018-07-26 ENCOUNTER — Other Ambulatory Visit: Payer: Self-pay

## 2018-07-26 ENCOUNTER — Ambulatory Visit (INDEPENDENT_AMBULATORY_CARE_PROVIDER_SITE_OTHER): Payer: BLUE CROSS/BLUE SHIELD | Admitting: Family Medicine

## 2018-07-26 DIAGNOSIS — F32A Depression, unspecified: Secondary | ICD-10-CM

## 2018-07-26 DIAGNOSIS — I456 Pre-excitation syndrome: Secondary | ICD-10-CM

## 2018-07-26 DIAGNOSIS — F329 Major depressive disorder, single episode, unspecified: Secondary | ICD-10-CM

## 2018-07-26 DIAGNOSIS — F419 Anxiety disorder, unspecified: Secondary | ICD-10-CM

## 2018-07-26 NOTE — Progress Notes (Signed)
Patient ID: Eugene Robinson, male   DOB: 07/28/1970, 48 y.o.   MRN: [id number]  Virtual Visit via video Note  This visit type was conducted due to national recommendations for restrictions regarding the COVID-19 pandemic (e.g. social distancing). This format is felt to be most appropriate for this patient at this time.  All issues noted in this document were discussed and addressed.  No physical exam was performed (except for noted visual exam findings with Video Visits).   I connected with Eugene Robinson on 07/27/18 at  3:00 PM EDT by a video enabled telemedicine application and verified that I am speaking with the correct person using two identifiers. Location patient: home Location provider: [facility] Persons participating in the virtual visit: patient, provider  I discussed the limitations, risks, security and privacy concerns of performing an evaluation and management service by vdieo and the availability of in person appointments. I also discussed with the patient that there may be a patient responsible charge related to this service. The patient expressed understanding and agreed to proceed.   HPI  Patient and I connected via video chat to follow-up on his anxiety depression and Wolff-Parkinson-White syndrome.    Patient states his mood has been stable on current dose of Lexapro 10 mg daily.  Does not feel overly anxious or to down or tearful.  States he is feeling back to his normal self, and also is happy that his anger is under better control.  Denies any SI or HI.  Wolff-Parkinson-White syndrome is stable.  Denies any issues with palpitations or racing heart.  Denies chest pain.  Denies any lower extremity swelling.  Heart rhythm and rate wonderfully controlled with metoprolol.  ROS:    Constitutional: Negative for chills, fatigue and fever.  HENT: Negative for congestion, ear pain, sinus pain and sore throat.   Eyes: Negative.   Respiratory: Negative for cough, shortness of  breath and wheezing.   Cardiovascular: Negative for chest pain, palpitations and leg swelling.  Gastrointestinal: Negative for abdominal pain, diarrhea, nausea and vomiting.  Genitourinary: Negative for dysuria, frequency and urgency.  Musculoskeletal: Negative for arthralgias and myalgias.  Skin: Negative for color change, pallor and rash.  Neurological: Negative for syncope, light-headedness and headaches.  Psychiatric/Behavioral: The patient is not nervous/anxious.     Past Medical History:  Diagnosis Date  . Chicken pox   . GERD (gastroesophageal reflux disease)   . Kidney stones   . PVC's (premature ventricular contractions) 03/19/2015  . WPW (Wolff-Parkinson-White syndrome)     Past Surgical History:  Procedure Laterality Date  . ADENOIDECTOMY      Family History  Problem Relation Age of Onset  . Cancer Mother        lung and breast  . Parkinson's disease Mother     Social History   Tobacco Use  . Smoking status: Never Smoker  . Smokeless tobacco: Never Used  Substance Use Topics  . Alcohol use: No    Alcohol/week: 0.0 standard drinks    Current Outpatient Medications:  .  escitalopram (LEXAPRO) 10 MG tablet, Take 1 tablet (10 mg total) by mouth daily., Disp: 90 tablet, Rfl: 1 .  metoprolol tartrate (LOPRESSOR) 50 MG tablet, Take 1 tablet (50 mg total) by mouth 2 (two) times daily., Disp: 180 tablet, Rfl: 3  EXAM:  GENERAL: alert, oriented, appears well and in no acute distress  HEENT: atraumatic, conjunttiva clear, no obvious abnormalities on inspection of external nose and ears  NECK: normal movements of the  head and neck  LUNGS: on inspection no signs of respiratory distress, breathing rate appears normal, no obvious gross SOB, gasping or wheezing  CV: no obvious cyanosis  MS: moves all visible extremities without noticeable abnormality  PSYCH/NEURO: pleasant and cooperative, no obvious depression or anxiety, speech and thought processing grossly  intact  ASSESSMENT AND PLAN:  Discussed the following assessment and plan:  Anxiety and depression  WPW (Wolff-Parkinson-White syndrome)  Patient will continue Lexapro 10 mg daily.  Mood is stable on this dose.   Metoprolol works well to control Wolff-Parkinson-White syndrome and keep his heart rate and rhythm under good control.  He will continue this medication.   I discussed the assessment and treatment plan with the patient. The patient was provided an opportunity to ask questions and all were answered. The patient agreed with the plan and demonstrated an understanding of the instructions.   The patient was advised to call back or seek an in-person evaluation if new issues arise.  Otherwise he will follow up in 3 months.   Tracey HarriesLauren M Jalin Erpelding, FNP

## 2018-07-27 ENCOUNTER — Encounter: Payer: Self-pay | Admitting: Family Medicine

## 2018-07-27 ENCOUNTER — Telehealth: Payer: Self-pay | Admitting: Family Medicine

## 2018-07-27 NOTE — Telephone Encounter (Signed)
Called Pt and scheduled his F/U appt for 10/26/18 @8 :00am

## 2018-07-27 NOTE — Telephone Encounter (Signed)
Please set up 3 month follow up appt for patient  Thanks,   LG

## 2018-08-04 ENCOUNTER — Ambulatory Visit (INDEPENDENT_AMBULATORY_CARE_PROVIDER_SITE_OTHER): Payer: BLUE CROSS/BLUE SHIELD | Admitting: Family Medicine

## 2018-08-04 ENCOUNTER — Encounter: Payer: Self-pay | Admitting: Family Medicine

## 2018-08-04 ENCOUNTER — Other Ambulatory Visit: Payer: Self-pay

## 2018-08-04 DIAGNOSIS — R059 Cough, unspecified: Secondary | ICD-10-CM

## 2018-08-04 DIAGNOSIS — J988 Other specified respiratory disorders: Secondary | ICD-10-CM | POA: Diagnosis not present

## 2018-08-04 DIAGNOSIS — Z20822 Contact with and (suspected) exposure to covid-19: Secondary | ICD-10-CM

## 2018-08-04 DIAGNOSIS — R05 Cough: Secondary | ICD-10-CM | POA: Diagnosis not present

## 2018-08-04 DIAGNOSIS — R0989 Other specified symptoms and signs involving the circulatory and respiratory systems: Secondary | ICD-10-CM

## 2018-08-04 DIAGNOSIS — R6889 Other general symptoms and signs: Secondary | ICD-10-CM | POA: Diagnosis not present

## 2018-08-04 MED ORDER — ALBUTEROL SULFATE HFA 108 (90 BASE) MCG/ACT IN AERS
2.0000 | INHALATION_SPRAY | Freq: Four times a day (QID) | RESPIRATORY_TRACT | 1 refills | Status: DC | PRN
Start: 2018-08-04 — End: 2020-07-11

## 2018-08-04 MED ORDER — LEVOFLOXACIN 750 MG PO TABS: 750.0000 mg | ORAL_TABLET | Freq: Every day | ORAL | 0 refills | Status: AC

## 2018-08-04 NOTE — Progress Notes (Signed)
Patient ID: Eugene Robinson, male   DOB: 1970-06-25, 48 y.o.   MRN: [id number]  Virtual Visit via video Note  This visit type was conducted due to national recommendations for restrictions regarding the COVID-19 pandemic (e.g. social distancing).  This format is felt to be most appropriate for this patient at this time.  All issues noted in this document were discussed and addressed.  No physical exam was performed (except for noted visual exam findings with Video Visits).   I connected with Amie PortlandErik Teti on 08/04/18 at  9:20 AM EDT by a video enabled telemedicine application verified that I am speaking with the correct person using two identifiers. Location patient: home Location provider: [facility] Persons participating in the virtual visit: patient, provider, patients wife  I discussed the limitations, risks, security and privacy concerns of performing an evaluation and management service by video and the availability of in person appointments. I also discussed with the patient that there may be a patient responsible charge related to this service. The patient expressed understanding and agreed to proceed.  HPI:  Patient and I connected via video due to complaints of cough, chest congestion, fullness in upper ABD and fever.  States mild cough began 7 days ago, and usually with less occurs he takes Mucinex and cough tends to go away fairly quickly but then 4 to 5 days.  States last night he had fever with chills and woke up sweaty this a.m.  Feels that his fever has broken, does not have a thermometer to measure his temperature however states his wife felt his head and he no longer feels hot or clammy.  Patient states his cough has become a little harsher and more wet.  Is coughing up some phlegm that is yellow and green in color.  Patient and wife are concerned that he could potentially have pneumonia, he was admitted to the hospital back in 2017 and had a pleural effusion with empyema and had to  have a chest tube along with IV antibiotics.  Patient also states that his employer is insisting he be tested for COVID-19.  Denies chest pain.  Denies wheezing.  Denies nausea, vomiting or diarrhea.     ROS: See pertinent positives and negatives per HPI.  Past Medical History:  Diagnosis Date  . Chicken pox   . GERD (gastroesophageal reflux disease)   . Kidney stones   . Pleural effusion 2017  . PVC's (premature ventricular contractions) 03/19/2015  . WPW (Wolff-Parkinson-White syndrome)     Past Surgical History:  Procedure Laterality Date  . ADENOIDECTOMY      Family History  Problem Relation Age of Onset  . Cancer Mother        lung and breast  . Parkinson's disease Mother     Social History   Tobacco Use  . Smoking status: Never Smoker  . Smokeless tobacco: Never Used  Substance Use Topics  . Alcohol use: No    Alcohol/week: 0.0 standard drinks      Current Outpatient Medications:  .  escitalopram (LEXAPRO) 10 MG tablet, Take 1 tablet (10 mg total) by mouth daily., Disp: 90 tablet, Rfl: 1 .  metoprolol tartrate (LOPRESSOR) 50 MG tablet, Take 1 tablet (50 mg total) by mouth 2 (two) times daily., Disp: 180 tablet, Rfl: 3 .  albuterol (VENTOLIN HFA) 108 (90 Base) MCG/ACT inhaler, Inhale 2 puffs into the lungs every 6 (six) hours as needed for wheezing or shortness of breath. Take 2 puffs in AM and  in PM for next 7 days, and can use as needed, to help open lungs up, Disp: 1 Inhaler, Rfl: 1 .  levofloxacin (LEVAQUIN) 750 MG tablet, Take 1 tablet (750 mg total) by mouth daily for 10 days., Disp: 10 tablet, Rfl: 0  EXAM:  GENERAL: alert, oriented, appears well and in no acute distress  HEENT: atraumatic, conjunttiva clear, no obvious abnormalities on inspection of external nose and ears  NECK: normal movements of the head and neck  LUNGS: on inspection no signs of respiratory distress, breathing rate appears normal, no obvious gross SOB, gasping or  wheezing  CV: no obvious cyanosis  MS: moves all visible extremities without noticeable abnormality  PSYCH/NEURO: pleasant and cooperative, no obvious depression or anxiety, speech and thought processing grossly intact  ASSESSMENT AND PLAN:  Discussed the following assessment and plan:  Cough in adult - Plan: levofloxacin (LEVAQUIN) 750 MG tablet, albuterol (VENTOLIN HFA) 108 (90 Base) MCG/ACT inhaler, MYCHART COVID-19 HOME MONITORING PROGRAM  Chest congestion - Plan: levofloxacin (LEVAQUIN) 750 MG tablet, albuterol (VENTOLIN HFA) 108 (90 Base) MCG/ACT inhaler, MYCHART COVID-19 HOME MONITORING PROGRAM  Respiratory infection - Plan: levofloxacin (LEVAQUIN) 750 MG tablet, albuterol (VENTOLIN HFA) 108 (90 Base) MCG/ACT inhaler  Suspected Covid-19 Virus Infection - Plan: MYCHART COVID-19 HOME MONITORING PROGRAM, Temperature monitoring  Due to patient's cough, chest congestion and fever he potentially could have COVID-19.  He has agreed to the 7-day self quarantine and the home monitoring program.  He will monitor his temperature and symptoms daily and make [place] aware if anything changes or worsens.  He will do supportive care such as taking Mucinex to reduce cough and thin secretions, get plenty of rest, Tylenol as needed for any fever or aches, plenty of fluids and he will remain home for the next 7 days.  Also due to patient's chest congestion and history of severe pneumonia that caused him to need a chest tube back in 2017, we will cover him with course of Levaquin in case of a pneumonia. He will also use albuterol inhaler to open up lungs.  Note for work given to patient.  I explained in the note that we are not testing for COVID-19 in the outpatient setting.  Patient aware that if his symptoms become severe and emergent such as difficulty breathing, chest pain, high fever that is not improved with Tylenol, severe abdominal pain/vomiting or diarrhea he is to call office right away and or go to  emergency room for evaluation.  Patient verbalizes understanding of these alarm/red flag symptoms and will keep close monitor on himself.  Patient's wife is also aware of what symptoms to be on look out for and will monitor her husband as well.   I discussed the assessment and treatment plan with the patient. The patient was provided an opportunity to ask questions and all were answered. The patient agreed with the plan and demonstrated an understanding of the instructions.   The patient was advised to call back or seek an in-person evaluation if the symptoms worsen or if the condition fails to improve as anticipated.   Tracey Harries, FNP

## 2018-08-12 DIAGNOSIS — F4322 Adjustment disorder with anxiety: Secondary | ICD-10-CM | POA: Diagnosis not present

## 2018-09-29 ENCOUNTER — Other Ambulatory Visit: Payer: Self-pay | Admitting: Family Medicine

## 2018-09-29 DIAGNOSIS — F419 Anxiety disorder, unspecified: Secondary | ICD-10-CM

## 2018-09-29 DIAGNOSIS — F329 Major depressive disorder, single episode, unspecified: Secondary | ICD-10-CM

## 2018-10-26 ENCOUNTER — Telehealth: Payer: Self-pay | Admitting: Family Medicine

## 2018-10-26 ENCOUNTER — Encounter: Payer: Self-pay | Admitting: Family Medicine

## 2018-10-26 ENCOUNTER — Ambulatory Visit (INDEPENDENT_AMBULATORY_CARE_PROVIDER_SITE_OTHER): Payer: BC Managed Care – PPO | Admitting: Family Medicine

## 2018-10-26 ENCOUNTER — Telehealth: Payer: Self-pay | Admitting: Lab

## 2018-10-26 ENCOUNTER — Other Ambulatory Visit: Payer: Self-pay

## 2018-10-26 DIAGNOSIS — I456 Pre-excitation syndrome: Secondary | ICD-10-CM

## 2018-10-26 DIAGNOSIS — F419 Anxiety disorder, unspecified: Secondary | ICD-10-CM | POA: Diagnosis not present

## 2018-10-26 DIAGNOSIS — F329 Major depressive disorder, single episode, unspecified: Secondary | ICD-10-CM | POA: Diagnosis not present

## 2018-10-26 DIAGNOSIS — F32A Depression, unspecified: Secondary | ICD-10-CM

## 2018-10-26 NOTE — Progress Notes (Signed)
Patient ID: Eugene Robinson, male   DOB: Aug 12, 1970, 48 y.o.   MRN: [id number]    Virtual Visit via video Note  This visit type was conducted due to national recommendations for restrictions regarding the COVID-19 pandemic (e.g. social distancing).  This format is felt to be most appropriate for this patient at this time.  All issues noted in this document were discussed and addressed.  No physical exam was performed (except for noted visual exam findings with Video Visits).   I connected with Amie PortlandErik Froning today at  8:00 AM EDT by a video enabled telemedicine application and verified that I am speaking with the correct person using two identifiers. Location patient: home Location provider: work or home office Persons participating in the virtual visit: patient, provider  I discussed the limitations, risks, security and privacy concerns of performing an evaluation and management service by video and the availability of in person appointments. I also discussed with the patient that there may be a patient responsible charge related to this service. The patient expressed understanding and agreed to proceed.   HPI:  Patient and I connected via video to follow-up on anxiety and depression and heart rate/Wolff-Parkinson-White syndrome.  Patient is feeling great on dose of Lexapro 10 mg daily.  States his mood is stable.  Does not feel overly anxious or to down or depressed.  Does not feel he gets angry too easily and this makes him very happy.  He is been able to spend a lot of time with his family.  He is also back to working, and this is been good for him as he was getting stir crazy staying at the house.  States at work therapy and safe with handwashing and wearing masks.  Patient's heart rate has been stable on current dose of metoprolol.  Denies any palpitations or feelings of racing heart.  Patient states cardiology has told him as long as he feels stable on the metoprolol, he can hold off on  having any sort of surgery.   ROS:   Constitutional: Negative for chills, fatigue and fever.  HENT: Negative for congestion, ear pain, sinus pain and sore throat.   Eyes: Negative.   Respiratory: Negative for cough, shortness of breath and wheezing.   Cardiovascular: Negative for chest pain, palpitations and leg swelling.  Gastrointestinal: Negative for abdominal pain, diarrhea, nausea and vomiting.  Genitourinary: Negative for dysuria, frequency and urgency.  Musculoskeletal: Negative for arthralgias and myalgias.  Skin: Negative for color change, pallor and rash.  Neurological: Negative for syncope, light-headedness and headaches.  Psychiatric/Behavioral: The patient is not nervous/anxious.     Past Medical History:  Diagnosis Date  . Chicken pox   . GERD (gastroesophageal reflux disease)   . Kidney stones   . Pleural effusion 2017  . PVC's (premature ventricular contractions) 03/19/2015  . WPW (Wolff-Parkinson-White syndrome)     Past Surgical History:  Procedure Laterality Date  . ADENOIDECTOMY      Family History  Problem Relation Age of Onset  . Cancer Mother        lung and breast  . Parkinson's disease Mother    Social History   Tobacco Use  . Smoking status: Never Smoker  . Smokeless tobacco: Never Used  Substance Use Topics  . Alcohol use: No    Alcohol/week: 0.0 standard drinks    Current Outpatient Medications:  .  albuterol (VENTOLIN HFA) 108 (90 Base) MCG/ACT inhaler, Inhale 2 puffs into the lungs every 6 (six) hours  as needed for wheezing or shortness of breath. Take 2 puffs in AM and in PM for next 7 days, and can use as needed, to help open lungs up, Disp: 1 Inhaler, Rfl: 1 .  escitalopram (LEXAPRO) 10 MG tablet, TAKE 1 TABLET DAILY, Disp: 90 tablet, Rfl: 3 .  metoprolol tartrate (LOPRESSOR) 50 MG tablet, Take 1 tablet (50 mg total) by mouth 2 (two) times daily., Disp: 180 tablet, Rfl: 3  EXAM:  GENERAL: alert, oriented, appears well and in no  acute distress  HEENT: atraumatic, conjunttiva clear, no obvious abnormalities on inspection of external nose and ears  NECK: normal movements of the head and neck  LUNGS: on inspection no signs of respiratory distress, breathing rate appears normal, no obvious gross SOB, gasping or wheezing  CV: no obvious cyanosis  MS: moves all visible extremities without noticeable abnormality  PSYCH/NEURO: pleasant and cooperative, no obvious depression or anxiety, speech and thought processing grossly intact  ASSESSMENT AND PLAN:  Discussed the following assessment and plan:  Anxiety and depression-patient stable on current dose of Lexapro.  He will continue this dose.  Yves Dill Parkinson White syndrome-heart rate and rhythm well controlled on metoprolol.  He will remain on this medicine.  Patient would like to hold off on having any sort of surgery and will only have surgery on his heart if absolutely necessary.   I discussed the assessment and treatment plan with the patient. The patient was provided an opportunity to ask questions and all were answered. The patient agreed with the plan and demonstrated an understanding of the instructions.   The patient was advised to call back or seek an in-person evaluation if the symptoms worsen or if the condition fails to improve as anticipated.  We will plan to have patient follow-up in about 5 or 6 months for complete physical exam including annual labs.  He is aware of vaccines for the flu will be available around September 2020  Jodelle Green, FNP

## 2018-10-26 NOTE — Telephone Encounter (Signed)
Called Pt No answer No VM will try back later. Was calling Pt to set up appt for physical in December 2020

## 2018-10-26 NOTE — Telephone Encounter (Signed)
Please schedule a CPE in December 2020 so we can get done before end of calender year  Thanks  LG

## 2018-10-27 ENCOUNTER — Telehealth: Payer: Self-pay | Admitting: Lab

## 2018-10-27 NOTE — Telephone Encounter (Signed)
Called Pt No answer No VM. Called Pt wife phone and she, she stated she will have him call our office to schedule a CPE appt for December.

## 2018-10-27 NOTE — Telephone Encounter (Signed)
Called Pt and scheduled OV Physical for 03/30/19 @ 3:40pm

## 2018-10-27 NOTE — Telephone Encounter (Signed)
Called Pt No answer No VM. Called Pt wife phone and she, she stated she will have him call our office to schedule a CPE appt for December. 

## 2019-03-30 ENCOUNTER — Ambulatory Visit: Payer: BC Managed Care – PPO | Admitting: Family Medicine

## 2019-04-27 DIAGNOSIS — Z20822 Contact with and (suspected) exposure to covid-19: Secondary | ICD-10-CM | POA: Diagnosis not present

## 2019-05-12 ENCOUNTER — Telehealth: Payer: Self-pay | Admitting: Family Medicine

## 2019-05-12 NOTE — Telephone Encounter (Signed)
Call pt He has h/o wollff parkinson white syndrome and this medication is filled by cardiology, Sherryl Manges.  Advise him to call their office for refill.

## 2019-05-12 NOTE — Telephone Encounter (Signed)
patient going to make TOC appt with [facility] ok to fill metoprolol?

## 2019-05-12 NOTE — Telephone Encounter (Signed)
Tried to reach patient no answer and no voicemail. 

## 2019-05-12 NOTE — Telephone Encounter (Signed)
Patient needs a refill on his metoprolol tartrate (LOPRESSOR) 50 MG tablet. Patient will call office the week of February 15th to make a TOC with K. [facility]Ills.

## 2019-05-15 ENCOUNTER — Telehealth: Payer: Self-pay | Admitting: Family Medicine

## 2019-05-15 ENCOUNTER — Other Ambulatory Visit: Payer: Self-pay

## 2019-05-15 MED ORDER — METOPROLOL TARTRATE 50 MG PO TABS: 50.0000 mg | ORAL_TABLET | Freq: Two times a day (BID) | ORAL | 3 refills | Status: DC

## 2019-05-15 NOTE — Telephone Encounter (Signed)
I have sent to [facility] on [address]

## 2019-05-15 NOTE — Telephone Encounter (Signed)
Pt needs refill on metoprolol tartrate (LOPRESSOR) 50 MG tablet sent to [facility] on [address]. Pt is completely out of medication.  Set up appt for 2/24

## 2019-06-07 ENCOUNTER — Other Ambulatory Visit: Payer: Self-pay

## 2019-06-07 ENCOUNTER — Ambulatory Visit (INDEPENDENT_AMBULATORY_CARE_PROVIDER_SITE_OTHER): Payer: BC Managed Care – PPO | Admitting: Family

## 2019-06-07 ENCOUNTER — Encounter: Payer: Self-pay | Admitting: Family

## 2019-06-07 DIAGNOSIS — F419 Anxiety disorder, unspecified: Secondary | ICD-10-CM | POA: Diagnosis not present

## 2019-06-07 DIAGNOSIS — F32A Depression, unspecified: Secondary | ICD-10-CM | POA: Insufficient documentation

## 2019-06-07 DIAGNOSIS — I456 Pre-excitation syndrome: Secondary | ICD-10-CM

## 2019-06-07 DIAGNOSIS — F329 Major depressive disorder, single episode, unspecified: Secondary | ICD-10-CM | POA: Diagnosis not present

## 2019-06-07 NOTE — Assessment & Plan Note (Addendum)
Pleased as patient does appear asymptomatic on Lopressor.  however I advised him that he needs to continue to follow-up with Dr. Maisie Fus, EP  per his last note.  Patient is very understanding regards to this, he will call to make an appointment

## 2019-06-07 NOTE — Assessment & Plan Note (Signed)
Stable, will continue Lexapro at this time

## 2019-06-07 NOTE — Progress Notes (Signed)
Virtual Visit via Video Note  I connected with@  on 06/07/19 at  1:30 PM EST by a video enabled telemedicine application and verified that I am speaking with the correct person using two identifiers.  Location patient: home Location provider:work Persons participating in the virtual visit: patient, provider  I discussed the limitations of evaluation and management by telemedicine and the availability of in person appointments. The patient expressed understanding and agreed to proceed.  Interactive audio and video telecommunications were attempted between this provider and patient, however failed, due to patient having technical difficulties or patient did not have access to video capability.  We continued and completed visit with audio only.     HPI: feeling well. NO complaints . No palpitations.   Wolff-Parkinson-White syndrome- has seen Dr Caryl Comes in 2019. Has felt well on metoprolol. At last visit, patient didn't feel that he needed any procedure at that time.  Denies exertional chest pain or pressure, numbness or tingling radiating to left arm or jaw, palpitations, dizziness, frequent headaches, changes in vision, or shortness of breath.   Doesn't check BP, HR at home .   Depression and anxiety- Started lexapro during separated. Sleeping well.  No si/hi    ROS: See pertinent positives and negatives per HPI.  Past Medical History:  Diagnosis Date  . Chicken pox   . GERD (gastroesophageal reflux disease)   . Kidney stones   . Osteomyelitis ([place]) 2017   spinal infection no clear etiology; hospitialized 2017 for 2 weeks.   . Pleural effusion 2017  . PVC's (premature ventricular contractions) 03/19/2015  . WPW (Wolff-Parkinson-White syndrome)     Past Surgical History:  Procedure Laterality Date  . ADENOIDECTOMY      Family History  Problem Relation Age of Onset  . Cancer Mother        lung and breast  . Parkinson's disease Mother     SOCIAL HX: never  smoker   Current Outpatient Medications:  .  albuterol (VENTOLIN HFA) 108 (90 Base) MCG/ACT inhaler, Inhale 2 puffs into the lungs every 6 (six) hours as needed for wheezing or shortness of breath. Take 2 puffs in AM and in PM for next 7 days, and can use as needed, to help open lungs up, Disp: 1 Inhaler, Rfl: 1 .  escitalopram (LEXAPRO) 10 MG tablet, TAKE 1 TABLET DAILY, Disp: 90 tablet, Rfl: 3 .  metoprolol tartrate (LOPRESSOR) 50 MG tablet, Take 1 tablet (50 mg total) by mouth 2 (two) times daily., Disp: 180 tablet, Rfl: 3   ASSESSMENT AND PLAN:  Discussed the following assessment and plan:  WPW (Wolff-Parkinson-White syndrome)  Anxiety and depression Problem List Items Addressed This Visit      Cardiovascular and Mediastinum   WPW (Wolff-Parkinson-White syndrome)    Pleased as patient does appear asymptomatic on Lopressor.  however I advised him that he needs to continue to follow-up with Dr. Marcello Moores, EP  per his last note.  Patient is very understanding regards to this, he will call to make an appointment        Other   Anxiety and depression    Stable, will continue Lexapro at this time       Of note, patient will return for CPE, labs  -we discussed possible serious and likely etiologies, options for evaluation and workup, limitations of telemedicine visit vs in person visit, treatment, treatment risks and precautions. Pt prefers to treat via telemedicine empirically rather then risking or undertaking an in person visit at this  moment. Patient agrees to seek prompt in person care if worsening, new symptoms arise, or if is not improving with treatment.   I discussed the assessment and treatment plan with the patient. The patient was provided an opportunity to ask questions and all were answered. The patient agreed with the plan and demonstrated an understanding of the instructions.   The patient was advised to call back or seek an in-person evaluation if the symptoms worsen  or if the condition fails to improve as anticipated.   Rennie Plowman, FNP  I have spent 35 minutes on phone with patient reviewing medical history and chronic conditions.

## 2019-06-08 ENCOUNTER — Telehealth: Payer: Self-pay | Admitting: Family Medicine

## 2019-06-08 NOTE — Telephone Encounter (Signed)
Return in about 3 months (around 09/04/2019) for Complete Physical Exam. No voicemail available. [employer].

## 2019-08-16 ENCOUNTER — Telehealth: Payer: Self-pay | Admitting: Family

## 2019-08-16 DIAGNOSIS — F419 Anxiety disorder, unspecified: Secondary | ICD-10-CM

## 2019-08-16 MED ORDER — ESCITALOPRAM OXALATE 10 MG PO TABS: 10.0000 mg | ORAL_TABLET | Freq: Every day | ORAL | 3 refills | Status: DC

## 2019-08-16 NOTE — Telephone Encounter (Signed)
Pt needs a refill on escitalopram (LEXAPRO) 10 MG tablet sent to [facility]

## 2020-05-05 ENCOUNTER — Other Ambulatory Visit: Payer: Self-pay | Admitting: Family

## 2020-05-16 ENCOUNTER — Ambulatory Visit: Payer: BC Managed Care – PPO | Admitting: Internal Medicine

## 2020-07-11 ENCOUNTER — Encounter: Payer: Self-pay | Admitting: Internal Medicine

## 2020-07-11 ENCOUNTER — Other Ambulatory Visit: Payer: Self-pay

## 2020-07-11 ENCOUNTER — Ambulatory Visit: Payer: BC Managed Care – PPO | Admitting: Internal Medicine

## 2020-07-11 VITALS — BP 122/72 | HR 63 | Ht 70.0 in | Wt 254.2 lb

## 2020-07-11 DIAGNOSIS — I493 Ventricular premature depolarization: Secondary | ICD-10-CM

## 2020-07-11 DIAGNOSIS — I456 Pre-excitation syndrome: Secondary | ICD-10-CM | POA: Diagnosis not present

## 2020-07-11 NOTE — Progress Notes (Signed)
      Patient Care Team: Allegra Grana, FNP as PCP - General (Family Medicine)   HPI  Eugene Robinson is a 50 y.o. male seen in follow-up for WPW with a history of tachypalpitations and documented preexcitation but also palpitations associated with PVCs   He tried a variety of beta blockers; metoprolol tartrate as been associated with significant diminution in the frequency of his events i.e. flutters. He has no attributable side effects  The patient denies chest pain, shortness of breath, nocturnal dyspnea, orthopnea or peripheral edema.  There have been no palpitations, lightheadedness or syncope.  Weight stable   He and his wife are parents of his adopted foster daughter-DOB 2013; she has made them exceedingly more active.    Past Medical History:  Diagnosis Date  . Chicken pox   . GERD (gastroesophageal reflux disease)   . Kidney stones   . Osteomyelitis ([facility]) 2017   spinal infection no clear etiology; hospitialized 2017 for 2 weeks.   . Pleural effusion 2017  . PVC's (premature ventricular contractions) 03/19/2015  . WPW (Wolff-Parkinson-White syndrome)     Past Surgical History:  Procedure Laterality Date  . ADENOIDECTOMY      Current Outpatient Medications  Medication Sig Dispense Refill  . escitalopram (LEXAPRO) 10 MG tablet Take 1 tablet (10 mg total) by mouth daily. 90 tablet 3  . metoprolol tartrate (LOPRESSOR) 50 MG tablet TAKE 1 TABLET BY MOUTH TWICE A DAY 180 tablet 3   No current facility-administered medications for this visit.    No Known Allergies    Review of Systems negative except from HPI and PMH  Physical Exam BP 122/72   Pulse 63   Ht 5\' 10"  (1.778 m)   Wt 254 lb 3.2 oz (115.3 kg)   SpO2 98%   BMI 36.47 kg/m  Well developed and nourished in no acute distress HENT normal Neck supple with JVP-  Flat  Clear Regular rate and rhythm, no murmurs or gallops Abd-soft with active BS No Clubbing cyanosis edema Skin-warm and dry A &  Oriented  Grossly normal sensory and motor function  ECG sinus @ 6+0  03/26/45 Ventricular preexcitation with RPS pathway  Assessment and  Plan  WPW  PVCs   Palps quiet on metoprolol Continue

## 2020-07-11 NOTE — Patient Instructions (Signed)
Medication Instructions:  - Your physician recommends that you continue on your current medications as directed. Please refer to the Current Medication list given to you today.  *If you need a refill on your cardiac medications before your next appointment, please call your pharmacy*   Lab Work: - none ordered  If you have labs (blood work) drawn today and your tests are completely normal, you will receive your results only by: . MyChart Message (if you have MyChart) OR . A paper copy in the mail If you have any lab test that is abnormal or we need to change your treatment, we will call you to review the results.   Testing/Procedures: - none ordered   Follow-Up: At [facility], you and your health needs are our priority.  As part of our continuing mission to provide you with exceptional heart care, we have created designated Provider Care Teams.  These Care Teams include your primary Cardiologist (physician) and Advanced Practice Providers (APPs -  Physician Assistants and Nurse Practitioners) who all work together to provide you with the care you need, when you need it.  We recommend signing up for the patient portal called "MyChart".  Sign up information is provided on this After Visit Summary.  MyChart is used to connect with patients for Virtual Visits (Telemedicine).  Patients are able to view lab/test results, encounter notes, upcoming appointments, etc.  Non-urgent messages can be sent to your provider as well.   To learn more about what you can do with MyChart, go to [email or web address].    Your next appointment:   2 year(s)  The format for your next appointment:   In Person  Provider:   Steven Klein, MD   Other Instructions n/a  

## 2020-08-03 ENCOUNTER — Other Ambulatory Visit: Payer: Self-pay | Admitting: Family

## 2020-08-03 DIAGNOSIS — F419 Anxiety disorder, unspecified: Secondary | ICD-10-CM

## 2021-05-05 ENCOUNTER — Other Ambulatory Visit: Payer: Self-pay | Admitting: Family

## 2021-07-04 ENCOUNTER — Other Ambulatory Visit: Payer: Self-pay | Admitting: Family

## 2021-07-04 DIAGNOSIS — F419 Anxiety disorder, unspecified: Secondary | ICD-10-CM

## 2021-07-04 NOTE — Telephone Encounter (Signed)
I tried to reach patient to schedule an appointment but he has not been seen since 2021. ?

## 2021-07-07 ENCOUNTER — Telehealth: Payer: Self-pay | Admitting: Family

## 2021-07-07 NOTE — Telephone Encounter (Signed)
Patient called and made an appointment with Arnett for 07/11/2021. He is ou of his metoprolol tartrate (LOPRESSOR) 50 MG tablet and is requesting a refill.  ?

## 2021-07-10 ENCOUNTER — Other Ambulatory Visit: Payer: Self-pay

## 2021-07-10 MED ORDER — METOPROLOL TARTRATE 50 MG PO TABS: 50.0000 mg | ORAL_TABLET | Freq: Two times a day (BID) | ORAL | 0 refills | Status: DC

## 2021-07-10 NOTE — Telephone Encounter (Signed)
I tried to reach patient to let him know that I had sent in [facility] but [facility] was not set up. Pt is to see Claris Che tomorrow.  ?

## 2021-07-11 ENCOUNTER — Ambulatory Visit: Payer: BC Managed Care – PPO | Admitting: Family

## 2021-07-11 ENCOUNTER — Encounter: Payer: Self-pay | Admitting: Family

## 2021-07-11 VITALS — BP 126/76 | HR 76 | Temp 98.3°F | Ht 70.0 in | Wt 261.4 lb

## 2021-07-11 DIAGNOSIS — I456 Pre-excitation syndrome: Secondary | ICD-10-CM

## 2021-07-11 DIAGNOSIS — F32A Depression, unspecified: Secondary | ICD-10-CM

## 2021-07-11 DIAGNOSIS — F419 Anxiety disorder, unspecified: Secondary | ICD-10-CM

## 2021-07-11 MED ORDER — ESCITALOPRAM OXALATE 10 MG PO TABS: 10.0000 mg | ORAL_TABLET | Freq: Every day | ORAL | 3 refills | Status: DC

## 2021-07-11 MED ORDER — METOPROLOL TARTRATE 50 MG PO TABS: 50.0000 mg | ORAL_TABLET | Freq: Two times a day (BID) | ORAL | 3 refills | Status: DC

## 2021-07-11 NOTE — Assessment & Plan Note (Signed)
Chronic, symptomatically stable. Previously followed with Dr. Graciela Husbands, electrophysiology.  He was told to return as needed if he develops symptoms. Continue metoprolol tartrate 50 mg twice daily.  ?

## 2021-07-11 NOTE — Patient Instructions (Addendum)
Please let me know when you are ready to do baseline screening labs ? ?Nice to see you! ? ?

## 2021-07-11 NOTE — Assessment & Plan Note (Signed)
Chronic, stable.  Continue Lexapro 10 mg 

## 2021-07-11 NOTE — Progress Notes (Signed)
? ?Subjective:  ? ? Patient ID: Eugene Robinson, male    DOB: 04/05/1971, 51 y.o.   MRN: [id number] ? ?CC: Eugene Robinson is a 51 y.o. male who presents today for follow up. Last seen 08/04/2018 ? ?HPI: Feels well today.  No new complaints.  No health changes since last seen.  ? ?Anxiety and depression- compliant with lexapro 10mg .  Feels well on this dose.  He does enjoy spending time with his 9-year-old daughter ? ?Previously followed with cardiology, Dr. Caryl Robinson last seen 07/11/2020 for Wolff-Parkinson-White syndrome  diagnosed when 51 years old.  He is compliant with metoprolol tartrate 50 mg twice daily.  ? ?Declines colonoscopy ? ? ?HISTORY:  ?Past Medical History:  ?Diagnosis Date  ? Chicken pox   ? GERD (gastroesophageal reflux disease)   ? Kidney stones   ? Osteomyelitis ([place]) 2017  ? spinal infection no clear etiology; hospitialized 2017 for 2 weeks.   ? Pleural effusion 2017  ? PVC's (premature ventricular contractions) 03/19/2015  ? WPW (Wolff-Parkinson-White syndrome)   ? ?Past Surgical History:  ?Procedure Laterality Date  ? ADENOIDECTOMY    ? ?Family History  ?Problem Relation Age of Onset  ? Cancer Mother   ?     lung and breast  ? Parkinson's disease Mother   ? ? ?Allergies: Patient has no known allergies. ?No current outpatient medications on file prior to visit.  ? ?No current facility-administered medications on file prior to visit.  ? ? ?Social History  ? ?Tobacco Use  ? Smoking status: Never  ? Smokeless tobacco: Never  ?Substance Use Topics  ? Alcohol use: No  ?  Alcohol/week: 0.0 standard drinks  ? Drug use: No  ? ? ?Review of Systems  ?Constitutional:  Negative for chills and fever.  ?Respiratory:  Negative for cough.   ?Cardiovascular:  Negative for chest pain and palpitations.  ?Gastrointestinal:  Negative for nausea and vomiting.  ?   ?Objective:  ?  ?BP 126/76 (BP Location: Left Arm, Patient Position: Sitting, Cuff Size: Large)   Pulse 76   Temp 98.3 ?F (36.8 ?C) (Oral)   Ht 5\' 10"   (1.778 m)   Wt 261 lb 6.4 oz (118.6 kg)   SpO2 96%   BMI 37.51 kg/m?  ?BP Readings from Last 3 Encounters:  ?07/11/21 126/76  ?07/11/20 122/72  ?04/26/18 112/70  ? ?Wt Readings from Last 3 Encounters:  ?07/11/21 261 lb 6.4 oz (118.6 kg)  ?07/11/20 254 lb 3.2 oz (115.3 kg)  ?06/07/19 240 lb (108.9 kg)  ? ? ?  07/11/2021  ?  3:33 PM 06/07/2019  ?  1:11 PM 10/26/2018  ?  8:01 AM  ?PHQ9 SCORE ONLY  ?PHQ-9 Total Score 0 0 0  ? ? ? ?Physical Exam ?Vitals reviewed.  ?Constitutional:   ?   Appearance: He is well-developed.  ?Cardiovascular:  ?   Rate and Rhythm: Regular rhythm.  ?   Heart sounds: Normal heart sounds.  ?Pulmonary:  ?   Effort: Pulmonary effort is normal. No respiratory distress.  ?   Breath sounds: Normal breath sounds. No wheezing, rhonchi or rales.  ?Skin: ?   General: Skin is warm and dry.  ?Neurological:  ?   Mental Status: He is alert.  ?Psychiatric:     ?   Speech: Speech normal.     ?   Behavior: Behavior normal.  ? ? ?   ?Assessment & Plan:  ? ?Problem List Items Addressed This Visit   ? ?  ?  Cardiovascular and Mediastinum  ? WPW (Wolff-Parkinson-White syndrome) - Primary  ?  Chronic, symptomatically stable. Previously followed with Dr. Caryl Robinson, electrophysiology.  He was told to return as needed if he develops symptoms. Continue metoprolol tartrate 50 mg twice daily.  ?  ?  ? Relevant Medications  ? metoprolol tartrate (LOPRESSOR) 50 MG tablet  ?  ? Other  ? Anxiety and depression  ?  Chronic, stable.  Continue Lexapro 10 mg ?  ?  ? Relevant Medications  ? escitalopram (LEXAPRO) 10 MG tablet  ? ? ? ?I have changed Suella Broad. Sutliff's escitalopram. I am also having him maintain his metoprolol tartrate. ? ? ?Meds ordered this encounter  ?Medications  ? escitalopram (LEXAPRO) 10 MG tablet  ?  Sig: Take 1 tablet (10 mg total) by mouth daily.  ?  Dispense:  90 tablet  ?  Refill:  3  ?  DX Code Needed  .  ?  Order Specific Question:   Supervising Provider  ?  Answer:   Crecencio Mc [[id number]]  ? metoprolol  tartrate (LOPRESSOR) 50 MG tablet  ?  Sig: Take 1 tablet (50 mg total) by mouth 2 (two) times daily.  ?  Dispense:  120 tablet  ?  Refill:  3  ?  Order Specific Question:   Supervising Provider  ?  Answer:   Crecencio Mc [[id number]]  ? ? ?Return precautions given.  ? ?Risks, benefits, and alternatives of the medications and treatment plan prescribed today were discussed, and patient expressed understanding.  ? ?Education regarding symptom management and diagnosis given to patient on AVS. ? ?Continue to follow with Burnard Hawthorne, FNP for routine health maintenance.  ? ?Shelby Mattocks and I agreed with plan.  ? ?Mable Paris, FNP ? ? ?

## 2021-07-14 NOTE — Telephone Encounter (Signed)
Pt had appointment 07/11/21.  ?

## 2021-08-02 ENCOUNTER — Other Ambulatory Visit: Payer: Self-pay | Admitting: Family

## 2021-08-02 DIAGNOSIS — I456 Pre-excitation syndrome: Secondary | ICD-10-CM

## 2021-08-12 ENCOUNTER — Other Ambulatory Visit: Payer: Self-pay

## 2021-08-12 ENCOUNTER — Emergency Department
Admission: EM | Admit: 2021-08-12 | Discharge: 2021-08-12 | Disposition: A | Discharge status: Home / Self Care | Payer: No Typology Code available for payment source | Attending: Emergency Medicine | Admitting: Emergency Medicine

## 2021-08-12 ENCOUNTER — Emergency Department: Payer: No Typology Code available for payment source

## 2021-08-12 DIAGNOSIS — R059 Cough, unspecified: Secondary | ICD-10-CM | POA: Insufficient documentation

## 2021-08-12 DIAGNOSIS — R0981 Nasal congestion: Secondary | ICD-10-CM | POA: Diagnosis not present

## 2021-08-12 DIAGNOSIS — R0781 Pleurodynia: Secondary | ICD-10-CM | POA: Diagnosis not present

## 2021-08-12 DIAGNOSIS — R7309 Other abnormal glucose: Secondary | ICD-10-CM | POA: Insufficient documentation

## 2021-08-12 DIAGNOSIS — R1084 Generalized abdominal pain: Secondary | ICD-10-CM | POA: Diagnosis not present

## 2021-08-12 DIAGNOSIS — R079 Chest pain, unspecified: Secondary | ICD-10-CM | POA: Diagnosis not present

## 2021-08-12 DIAGNOSIS — R0789 Other chest pain: Secondary | ICD-10-CM | POA: Diagnosis not present

## 2021-08-12 DIAGNOSIS — I959 Hypotension, unspecified: Secondary | ICD-10-CM | POA: Diagnosis not present

## 2021-08-12 LAB — CBC
HCT: 43.1 % (ref 39.0–52.0)
Hemoglobin: 14.2 g/dL (ref 13.0–17.0)
MCH: 27.5 pg (ref 26.0–34.0)
MCHC: 32.9 g/dL (ref 30.0–36.0)
MCV: 83.4 fL (ref 80.0–100.0)
Platelets: 218 10*3/uL (ref 150–400)
RBC: 5.17 MIL/uL (ref 4.22–5.81)
RDW: 13.1 % (ref 11.5–15.5)
WBC: 7.1 10*3/uL (ref 4.0–10.5)
nRBC: 0 % (ref 0.0–0.2)

## 2021-08-12 LAB — BASIC METABOLIC PANEL
Anion gap: 9 (ref 5–15)
BUN: 13 mg/dL (ref 6–20)
CO2: 25 mmol/L (ref 22–32)
Calcium: 9.1 mg/dL (ref 8.9–10.3)
Chloride: 104 mmol/L (ref 98–111)
Creatinine, Ser: 1.07 mg/dL (ref 0.61–1.24)
GFR, Estimated: >60 mL/min (ref >60)
Glucose, Bld: 131 mg/dL — ABNORMAL HIGH (ref 70–99)
Potassium: 3.9 mmol/L (ref 3.5–5.1)
Sodium: 138 mmol/L (ref 135–145)

## 2021-08-12 LAB — TROPONIN I (HIGH SENSITIVITY): Troponin I (High Sensitivity): 4 ng/L (ref <18)

## 2021-08-12 MED ORDER — IBUPROFEN 400 MG PO TABS
400.0000 mg | ORAL_TABLET | Freq: Once | ORAL | Status: AC
Start: 2021-08-12 — End: 2021-08-12
  Administered 2021-08-12: 400 mg via ORAL
  Filled 2021-08-12: qty 1

## 2021-08-12 MED ORDER — ACETAMINOPHEN 500 MG PO TABS
1000.0000 mg | ORAL_TABLET | Freq: Once | ORAL | Status: AC
  Administered 2021-08-12: 1000 mg via ORAL
  Filled 2021-08-12: qty 2

## 2021-08-12 NOTE — ED Triage Notes (Addendum)
Patient to ER via [facility]. Reports coughing three days ago and developing a pain in his RUQ/ ribs. Today, reports sneezing hard and feeling that pain again. Reports becoming diaphoretic and nauseated.  ? ?Hx of WPW. No pacemaker/ defibrillator.  ?

## 2021-08-12 NOTE — ED Provider Notes (Signed)
? ?[facility] ?Provider Note ? ? ? Event Date/Time  ? First MD Initiated Contact with Patient 08/12/21 1649   ?  (approximate) ? ? ?History  ? ?Chest Pain ? ? ?HPI ? ?Eugene Robinson is a 51 y.o. male with a past medical history of WPW, pneumothorax, anxiety, depression who presents for evaluation of some acute right-sided chest pain that occurred after he sneezed while at work.  Patient states he thinks he heard a pop and felt some acute right chest pain.  States he had some congestion and cough couple days ago and has not gotten better and he otherwise has been feeling good before sneezing earlier today.  No left-sided chest pain, back pain, fevers, headache, earache, sore throat, nausea, vomiting, diarrhea, rash or any other acute sick symptoms.  Trauma.  No analgesia prior to arrival. ? ?  ? ? ?Physical Exam  ?Triage Vital Signs: ?ED Triage Vitals [08/12/21 1402]  ?Enc Vitals Group  ?   BP 112/63  ?   Pulse Rate 65  ?   Resp 19  ?   Temp 97.9 ?F (36.6 ?C)  ?   Temp Source Oral  ?   SpO2 94 %  ?   Weight   ?   Height 5\' 10"  (1.778 m)  ?   Head Circumference   ?   Peak Flow   ?   Pain Score 5  ?   Pain Loc   ?   Pain Edu?   ?   Excl. in GC?   ? ? ?Most recent vital signs: ?Vitals:  ? 08/12/21 1402  ?BP: 112/63  ?Pulse: 65  ?Resp: 19  ?Temp: 97.9 ?F (36.6 ?C)  ?SpO2: 94%  ? ? ?General: Awake, no distress.  ?CV:  Good peripheral perfusion.  2+ radial pulses. ?Resp:  Normal effort.  Bilaterally.  No wheezing rhonchi or diminished breath sounds. ?Abd:  No distention.  Soft throughout including the right upper quadrant. ?Other:  Mild tenderness over the right ribs without any overlying skin changes ? ? ?ED Results / Procedures / Treatments  ?Labs ?(all labs ordered are listed, but only abnormal results are displayed) ?Labs Reviewed  ?BASIC METABOLIC PANEL - Abnormal; Notable for the following components:  ?    Result Value  ? Glucose, Bld 131 (*)   ? All other components within normal limits   ?CBC  ?TROPONIN I (HIGH SENSITIVITY)  ? ? ? ?EKG ? ?ECG shows sinus rhythm with a ventricular rate of 72 and WPW morphology with delta wave visible in several leads but otherwise unremarkable intervals without clear evidence of acute ischemia and there is nicely nonspecific change in V2. ? ?RADIOLOGY ?Chest reviewed by myself shows no focal consoidation, effusion, edema, pneumothorax or other clear acute thoracic process. I also reviewed radiology interpretation and agree with findings described. ? ? ? ?PROCEDURES: ? ?Critical Care performed: No ? ?Procedures ? ? ?MEDICATIONS ORDERED IN ED: ?Medications  ?acetaminophen (TYLENOL) tablet 1,000 mg (has no administration in time range)  ?ibuprofen (ADVIL) tablet 400 mg (has no administration in time range)  ? ? ? ?IMPRESSION / MDM / ASSESSMENT AND PLAN / ED COURSE  ?I reviewed the triage vital signs and the nursing notes. ?             ?               ? ?Differential diagnosis includes, but is not limited to pneumothorax, rib fracture, MSK with a  lower suspicion and history of improving cough without any shortness of breath or fever or ongoing congestion for new infectious process ? ?Chest x-ray without evidence of effusion pneumothorax or displaced rib fracture. ? ?ECG shows sinus rhythm with a ventricular rate of 72 and WPW morphology with delta wave visible in several leads but otherwise unremarkable intervals without clear evidence of acute ischemia and there is nicely nonspecific change in V2.  CBC is unremarkable.  BMP is unremarkable. ? ?I suspect musculoskeletal etiology.  We will give some Tylenol ibuprofen.  Patient is already feeling better from when it first started.  Advised to follow-up outpatient and return for any new or worsening of symptoms.  Low suspicion for other immediate life and process.  Discharged in stable condition. ? ? ? ?  ? ? ?FINAL CLINICAL IMPRESSION(S) / ED DIAGNOSES  ? ?Final diagnoses:  ?Right-sided chest pain  ? ? ? ?Rx / DC Orders   ? ?ED Discharge Orders   ? ? None  ? ?  ? ? ? ?Note:  This document was prepared using Dragon voice recognition software and may include unintentional dictation errors. ?  ?Gilles Chiquito, MD ?08/12/21 1701 ? ?

## 2021-08-12 NOTE — ED Triage Notes (Signed)
Pt comes into the ED via EMS from work at [employer], pt c/o right lateral rib pain for the past couple of days and today sneezed and felt something pop and having increased pain.  ? ?108/56 ?HR70 ?97%RA ?

## 2021-11-04 ENCOUNTER — Other Ambulatory Visit: Payer: Self-pay | Admitting: Family

## 2021-11-04 DIAGNOSIS — I456 Pre-excitation syndrome: Secondary | ICD-10-CM

## 2021-11-07 ENCOUNTER — Other Ambulatory Visit: Payer: Self-pay | Admitting: Family

## 2021-11-07 DIAGNOSIS — F419 Anxiety disorder, unspecified: Secondary | ICD-10-CM

## 2021-11-29 ENCOUNTER — Other Ambulatory Visit: Payer: Self-pay | Admitting: Family

## 2021-11-29 DIAGNOSIS — I456 Pre-excitation syndrome: Secondary | ICD-10-CM

## 2021-12-29 ENCOUNTER — Ambulatory Visit: Payer: BC Managed Care – PPO | Admitting: Family

## 2021-12-29 ENCOUNTER — Encounter: Payer: Self-pay | Admitting: Family

## 2021-12-29 VITALS — BP 130/80 | HR 69 | Temp 98.5°F | Ht 71.0 in | Wt 256.6 lb

## 2021-12-29 DIAGNOSIS — R051 Acute cough: Secondary | ICD-10-CM | POA: Diagnosis not present

## 2021-12-29 DIAGNOSIS — J40 Bronchitis, not specified as acute or chronic: Secondary | ICD-10-CM

## 2021-12-29 LAB — POC COVID19 BINAXNOW: SARS Coronavirus 2 Ag: NEGATIVE

## 2021-12-29 LAB — POCT INFLUENZA A/B: Influenza A, POC: NEGATIVE

## 2021-12-29 MED ORDER — BENZONATATE 100 MG PO CAPS: 100.0000 mg | ORAL_CAPSULE | Freq: Three times a day (TID) | ORAL | 1 refills | Status: DC | PRN

## 2021-12-29 NOTE — Patient Instructions (Addendum)
Start tessalon for cough  You may continue Mucinex with plenty water to help break up thick congestion.    Most certainly if you feel symptoms are worsening or not resolving, please let me know right away as I would recommend an antibiotic at that time  Acute Bronchitis, Adult  Acute bronchitis is sudden inflammation of the main airways (bronchi) that come off the windpipe (trachea) in the lungs. The swelling causes the airways to get smaller and make more mucus than normal. This can make it hard to breathe and can cause coughing or noisy breathing (wheezing). Acute bronchitis may last several weeks. The cough may last longer. Allergies, asthma, and exposure to smoke may make the condition worse. What are the causes? This condition can be caused by germs and by substances that irritate the lungs, including: Cold and flu viruses. The most common cause of this condition is the virus that causes the common cold. Bacteria. This is less common. Breathing in substances that irritate the lungs, including: Smoke from cigarettes and other forms of tobacco. Dust and pollen. Fumes from household cleaning products, gases, or burned fuel. Indoor or outdoor air pollution. What increases the risk? The following factors may make you more likely to develop this condition: A weak body's defense system, also called the immune system. A condition that affects your lungs and breathing, such as asthma. What are the signs or symptoms? Common symptoms of this condition include: Coughing. This may bring up clear, yellow, or green mucus from your lungs (sputum). Wheezing. Runny or stuffy nose. Having too much mucus in your lungs (chest congestion). Shortness of breath. Aches and pains, including sore throat or chest. How is this diagnosed? This condition is usually diagnosed based on: Your symptoms and medical history. A physical exam. You may also have other tests, including tests to rule out other  conditions, such as pneumonia. These tests include: A test of lung function. Test of a mucus sample to look for the presence of bacteria. Tests to check the oxygen level in your blood. Blood tests. Chest X-ray. How is this treated? Most cases of acute bronchitis clear up over time without treatment. Your health care provider may recommend: Drinking more fluids to help thin your mucus so it is easier to cough up. Taking inhaled medicine (inhaler) to improve air flow in and out of your lungs. Using a vaporizer or a humidifier. These are machines that add water to the air to help you breathe better. Taking a medicine that thins mucus and clears congestion (expectorant). Taking a medicine that prevents or stops coughing (cough suppressant). It is not common to take an antibiotic medicine for this condition. Follow these instructions at home:  Take over-the-counter and prescription medicines only as told by your health care provider. Use an inhaler, vaporizer, or humidifier as told by your health care provider. Take two teaspoons (10 mL) of honey at bedtime to lessen coughing at night. Drink enough fluid to keep your urine pale yellow. Do not use any products that contain nicotine or tobacco. These products include cigarettes, chewing tobacco, and vaping devices, such as e-cigarettes. If you need help quitting, ask your health care provider. Get plenty of rest. Return to your normal activities as told by your health care provider. Ask your health care provider what activities are safe for you. Keep all follow-up visits. This is important. How is this prevented? To lower your risk of getting this condition again: Wash your hands often with soap and water for  at least 20 seconds. If soap and water are not available, use hand sanitizer. Avoid contact with people who have cold symptoms. Try not to touch your mouth, nose, or eyes with your hands. Avoid breathing in smoke or chemical fumes.  Breathing smoke or chemical fumes will make your condition worse. Get the flu shot every year. Contact a health care provider if: Your symptoms do not improve after 2 weeks. You have trouble coughing up the mucus. Your cough keeps you awake at night. You have a fever. Get help right away if you: Cough up blood. Feel pain in your chest. Have severe shortness of breath. Faint or keep feeling like you are going to faint. Have a severe headache. Have a fever or chills that get worse. These symptoms may represent a serious problem that is an emergency. Do not wait to see if the symptoms will go away. Get medical help right away. Call your local emergency services (911 in the [place] Do not drive yourself to the hospital. Summary Acute bronchitis is inflammation of the main airways (bronchi) that come off the windpipe (trachea) in the lungs. The swelling causes the airways to get smaller and make more mucus than normal. Drinking more fluids can help thin your mucus so it is easier to cough up. Take over-the-counter and prescription medicines only as told by your health care provider. Do not use any products that contain nicotine or tobacco. These products include cigarettes, chewing tobacco, and vaping devices, such as e-cigarettes. If you need help quitting, ask your health care provider. Contact a health care provider if your symptoms do not improve after 2 weeks. This information is not intended to replace advice given to you by your health care provider. Make sure you discuss any questions you have with your health care provider. Document Revised: 07/10/2021 Document Reviewed: 07/31/2020 Elsevier Patient Education  [place].

## 2021-12-29 NOTE — Assessment & Plan Note (Signed)
COVID and flu negative.  No acute respiratory distress.  Patient is nontoxic in appearance . duration 7 days.  We jointly agreed most likely viral in etiology and we have opted to treat conservatively.  I provided him with Ladona Ridgel and he will continue Mucinex at home.  I asked him to call me in the certainly if symptoms were to persist is at that point I would recommend starting Augmentin for suspected bacterial URI.

## 2021-12-29 NOTE — Progress Notes (Signed)
Subjective:    Patient ID: Eugene Robinson, male    DOB: 1970/09/25, 51 y.o.   MRN: [id number]  CC: Eugene Robinson is a 51 y.o. male who presents today for an acute visit.    HPI: Complains productive cough, x one week, unchanged.   Sputum is honey in color.  Endorses nasal congestion  No sob, cp, sinus pain, ear pain, sore throat, palpitations  He has tried mucinex without relief.   He is walking and playing disc golf.    H/o WPW, pleural effusion No ho lung disease  Never smoker HISTORY:  Past Medical History:  Diagnosis Date   Chicken pox    GERD (gastroesophageal reflux disease)    Kidney stones    Osteomyelitis (HCC) 2017   spinal infection no clear etiology; hospitialized 2017 for 2 weeks.    Pleural effusion 2017   PVC's (premature ventricular contractions) 03/19/2015   WPW (Wolff-Parkinson-White syndrome)    Past Surgical History:  Procedure Laterality Date   ADENOIDECTOMY     Family History  Problem Relation Age of Onset   Cancer Mother        lung and breast   Parkinson's disease Mother     Allergies: Patient has no known allergies. Current Outpatient Medications on File Prior to Visit  Medication Sig Dispense Refill   escitalopram (LEXAPRO) 10 MG tablet TAKE 1 TABLET BY MOUTH EVERY DAY 90 tablet 3   metoprolol tartrate (LOPRESSOR) 50 MG tablet TAKE 1 TABLET BY MOUTH TWICE A DAY 60 tablet 0   No current facility-administered medications on file prior to visit.    Social History   Tobacco Use   Smoking status: Never   Smokeless tobacco: Never  Substance Use Topics   Alcohol use: No    Alcohol/week: 0.0 standard drinks of alcohol   Drug use: No    Review of Systems  Constitutional:  Negative for chills and fever.  HENT:  Positive for congestion. Negative for ear pain, sinus pain and sore throat.   Respiratory:  Positive for cough. Negative for shortness of breath and wheezing.   Cardiovascular:  Negative for chest pain and  palpitations.  Gastrointestinal:  Negative for nausea and vomiting.      Objective:    BP 130/80 (BP Location: Left Arm, Patient Position: Sitting, Cuff Size: Normal)   Pulse 69   Temp 98.5 F (36.9 C) (Oral)   Ht 5\' 11"  (1.803 m)   Wt 256 lb 9.6 oz (116.4 kg)   SpO2 95%   BMI 35.79 kg/m    Physical Exam Vitals reviewed.  Constitutional:      Appearance: He is well-developed.  HENT:     Head: Normocephalic and atraumatic.     Right Ear: Hearing, tympanic membrane, ear canal and external ear normal. No decreased hearing noted. No drainage, swelling or tenderness. No middle ear effusion. Tympanic membrane is not injected, erythematous or bulging.     Left Ear: Hearing, tympanic membrane, ear canal and external ear normal. No decreased hearing noted. No drainage, swelling or tenderness.  No middle ear effusion. Tympanic membrane is not injected, erythematous or bulging.     Nose: Nose normal.     Right Sinus: No maxillary sinus tenderness or frontal sinus tenderness.     Left Sinus: No maxillary sinus tenderness or frontal sinus tenderness.     Mouth/Throat:     Pharynx: Uvula midline. No oropharyngeal exudate or posterior oropharyngeal erythema.     Tonsils: No tonsillar  abscesses.  Eyes:     Conjunctiva/sclera: Conjunctivae normal.  Cardiovascular:     Rate and Rhythm: Regular rhythm.     Heart sounds: Normal heart sounds.  Pulmonary:     Effort: Pulmonary effort is normal. No respiratory distress.     Breath sounds: Normal breath sounds. No wheezing, rhonchi or rales.  Lymphadenopathy:     Head:     Right side of head: No submental, submandibular, tonsillar, preauricular, posterior auricular or occipital adenopathy.     Left side of head: No submental, submandibular, tonsillar, preauricular, posterior auricular or occipital adenopathy.     Cervical: No cervical adenopathy.  Skin:    General: Skin is warm and dry.  Neurological:     Mental Status: He is alert.   Psychiatric:        Speech: Speech normal.        Behavior: Behavior normal.        Assessment & Plan:   Problem List Items Addressed This Visit       Respiratory   Bronchitis - Primary    COVID and flu negative.  No acute respiratory distress.  Patient is nontoxic in appearance . duration 7 days.  We jointly agreed most likely viral in etiology and we have opted to treat conservatively.  I provided him with Eugene Robinson and he will continue Mucinex at home.  I asked him to call me in the certainly if symptoms were to persist is at that point I would recommend starting Augmentin for suspected bacterial URI.      Relevant Medications   benzonatate (TESSALON) 100 MG capsule   Other Visit Diagnoses     Acute cough       Relevant Orders   POCT Influenza A/B (Completed)   POC COVID-19 (Completed)         I am having Eugene Robinson. Kandel start on benzonatate. I am also having him maintain his escitalopram and metoprolol tartrate.   Meds ordered this encounter  Medications   benzonatate (TESSALON) 100 MG capsule    Sig: Take 1 capsule (100 mg total) by mouth 3 (three) times daily as needed for cough.    Dispense:  20 capsule    Refill:  1    Order Specific Question:   Supervising Provider    Answer:   Crecencio Mc [[id number]]    Return precautions given.   Risks, benefits, and alternatives of the medications and treatment plan prescribed today were discussed, and patient expressed understanding.   Education regarding symptom management and diagnosis given to patient on AVS.  Continue to follow with Eugene Hawthorne, FNP for routine health maintenance.   Eugene Robinson and I agreed with plan.   Mable Paris, FNP

## 2022-01-08 ENCOUNTER — Telehealth: Payer: Self-pay | Admitting: Family

## 2022-01-08 DIAGNOSIS — I456 Pre-excitation syndrome: Secondary | ICD-10-CM

## 2022-01-14 ENCOUNTER — Telehealth: Payer: Self-pay | Admitting: Family

## 2022-01-14 DIAGNOSIS — J40 Bronchitis, not specified as acute or chronic: Secondary | ICD-10-CM

## 2022-01-14 MED ORDER — AMOXICILLIN-POT CLAVULANATE 875-125 MG PO TABS
1.0000 | ORAL_TABLET | Freq: Two times a day (BID) | ORAL | 0 refills | Status: AC
Start: 2022-01-14 — End: 2022-01-21

## 2022-01-14 NOTE — Telephone Encounter (Signed)
Patient called and stated he saw Arnett on 9/18. She gave him medication. Arnett told patient if not feeling better to call office and she would prescribe something else.

## 2022-01-14 NOTE — Telephone Encounter (Signed)
Call pt  I am so sorry symptoms have persisted.  At this point I am concerned it may be a bacterial URI.  [facility]   Please advise him to let me know if symptoms not resolved or certainly make a follow-up appointment with me if he needs  Ensure to take probiotics while on antibiotics and also for 2 weeks after completion. This can either be by eating yogurt daily or taking a probiotic supplement over the counter such as Culturelle.It is important to re-colonize the gut with good bacteria and also to prevent any diarrheal infections associated with antibiotic use.

## 2022-01-15 NOTE — Telephone Encounter (Signed)
Called pt but was unable to leave vm because it has not been set up yet

## 2022-01-19 NOTE — Telephone Encounter (Signed)
Called patient but unable to lvm because it has not been set up yet

## 2022-01-26 ENCOUNTER — Other Ambulatory Visit: Payer: Self-pay

## 2022-01-26 DIAGNOSIS — I456 Pre-excitation syndrome: Secondary | ICD-10-CM

## 2022-01-26 MED ORDER — METOPROLOL TARTRATE 50 MG PO TABS: 50.0000 mg | ORAL_TABLET | Freq: Two times a day (BID) | ORAL | 0 refills | Status: DC

## 2022-01-26 NOTE — Telephone Encounter (Signed)
Pt notified that prescription sent.  ?

## 2022-01-26 NOTE — Telephone Encounter (Signed)
I was able to speak with patient when he called about med refill & he stated that he was better URI cleared.

## 2022-01-26 NOTE — Telephone Encounter (Signed)
Pt need refill on metoprolol tartrate sent to [facility]

## 2022-02-19 ENCOUNTER — Other Ambulatory Visit: Payer: Self-pay | Admitting: Family

## 2022-02-19 DIAGNOSIS — I456 Pre-excitation syndrome: Secondary | ICD-10-CM

## 2022-04-30 ENCOUNTER — Encounter: Payer: Self-pay | Admitting: Family

## 2022-07-13 ENCOUNTER — Ambulatory Visit: Payer: BC Managed Care – PPO | Admitting: Family

## 2022-07-22 ENCOUNTER — Encounter: Payer: Self-pay | Admitting: Family Medicine

## 2022-07-22 ENCOUNTER — Telehealth: Payer: BC Managed Care – PPO | Admitting: Family Medicine

## 2022-07-22 VITALS — Temp 103.0°F | Ht 71.0 in | Wt 254.0 lb

## 2022-07-22 DIAGNOSIS — J069 Acute upper respiratory infection, unspecified: Secondary | ICD-10-CM | POA: Diagnosis not present

## 2022-07-22 NOTE — Progress Notes (Signed)
Virtual Visit via Video note  I connected with Eugene Robinson on 07/31/22 at 1625 by video and verified that I am speaking with the correct person using two identifiers. Eugene Robinson is currently located at home and is currently with him during visit. The provider, Dana Allan, MD is located in their office at time of visit.  I discussed the limitations, risks, security and privacy concerns of performing an evaluation and management service by video and the availability of in person appointments. I also discussed with the patient that there may be a patient responsible charge related to this service. The patient expressed understanding and agreed to proceed.  Subjective: PCP: Allegra Grana, FNP  Chief Complaint  Patient presents with   Acute Home Visit    Fever/runny nose/chills/congestion/dizzyX 11/2 weeks/     HPI Patient reports 1-1/2-week ago developed vertigo.  Has had vertigo previously in the past.  Took Dramamine which helped a little.  Reports yesterday seem to be worse.  Developed fever 101, nasal congestion.  Took NyQuil.  Endorses cough, feels like postnasal, muscle aches.  Appetite has mildly decreased but endorses eating okay and hydrating well.  Has not taken COVID test at home.  Reports COVID vaccination completed.  Denies any chest pain, shortness of breath, or recent sick contacts.   History of WPW   ROS: Per HPI  Current Outpatient Medications:    escitalopram (LEXAPRO) 10 MG tablet, TAKE 1 TABLET BY MOUTH EVERY DAY, Disp: 90 tablet, Rfl: 3   metoprolol tartrate (LOPRESSOR) 50 MG tablet, TAKE 1 TABLET BY MOUTH TWICE A DAY, Disp: 180 tablet, Rfl: 1  Observations/Objective: Physical Exam Vitals reviewed.  Constitutional:      General: He is not in acute distress.    Appearance: Normal appearance. He is not toxic-appearing.  Pulmonary:     Effort: Pulmonary effort is normal.  Neurological:     Mental Status: He is alert. Mental status is at  baseline.  Psychiatric:        Mood and Affect: Mood normal.        Behavior: Behavior normal.        Thought Content: Thought content normal.        Judgment: Judgment normal.    Assessment and Plan: Upper respiratory tract infection, unspecified type Assessment & Plan: Symptoms of vertigo ongoing x greater than 1 week.  Recent febrile.  X 1 day.  Not in any acute respiratory distress.  Recommend patient come to clinic in a.m. for evaluation.  Plan for COVID, flu A&B testing.  Patient agreeable an appointment scheduled. Continue symptomatic treatment for overnight. If any worsening shortness of breath, chest pain patient aware to go to ED. Strict return precautions provided.      Follow Up Instructions: Return in about 1 day (around 07/23/2022).   I discussed the assessment and treatment plan with the patient. The patient was provided an opportunity to ask questions and all were answered. The patient agreed with the plan and demonstrated an understanding of the instructions.   The patient was advised to call back or seek an in-person evaluation if the symptoms worsen or if the condition fails to improve as anticipated.  The above assessment and management plan was discussed with the patient. The patient verbalized understanding of and has agreed to the management plan. Patient is aware to call the clinic if symptoms persist or worsen. Patient is aware when to return to the clinic for a follow-up visit. Patient educated  on when it is appropriate to go to the emergency department.     Dana Allan, MD

## 2022-07-23 ENCOUNTER — Encounter: Payer: Self-pay | Admitting: Family Medicine

## 2022-07-23 ENCOUNTER — Ambulatory Visit: Payer: BC Managed Care – PPO | Admitting: Family Medicine

## 2022-07-23 VITALS — BP 110/70 | HR 57 | Temp 98.2°F | Ht 70.0 in | Wt 260.8 lb

## 2022-07-23 DIAGNOSIS — J069 Acute upper respiratory infection, unspecified: Secondary | ICD-10-CM

## 2022-07-23 DIAGNOSIS — R059 Cough, unspecified: Secondary | ICD-10-CM

## 2022-07-23 DIAGNOSIS — U071 COVID-19: Secondary | ICD-10-CM | POA: Diagnosis not present

## 2022-07-23 LAB — POC COVID19 BINAXNOW: SARS Coronavirus 2 Ag: POSITIVE — AB

## 2022-07-23 LAB — POCT INFLUENZA A/B
Influenza A, POC: NEGATIVE
Influenza B, POC: NEGATIVE

## 2022-07-23 MED ORDER — MOLNUPIRAVIR EUA 200MG CAPSULE: 4.0000 | ORAL_CAPSULE | Freq: Two times a day (BID) | ORAL | 0 refills | Status: AC

## 2022-07-23 NOTE — Patient Instructions (Addendum)
It was a pleasure meeting you today. Thank you for allowing me to take part in your health care.  Our goals for today as we discussed include:  Symptomatic management for fever, muscle aches and headaches  Start Molnupiravir 4 capsules 2 times a day for 5 days -Tylenol 325-500 mg every 6 hours as needed -Ibuprofen 200 mg every 8 hours as needed  Stay well hydrated Rest as needed with frequent repositioning and ambulation.  Increase activity as soon as tolerated to help with recovery.  Continue wearing masks, hand washing and self isolation until symptom free.  If you have worsening symptoms, especially difficulty breathing please call 911 or have someone take you to the emergency department.   Colonoscopy is due. Please schedule appointment with PCP to discuss.  Recommend Shingles vaccine.  This is a 2 dose series and can be given at your local pharmacy.  Please talk to your pharmacist about this.    If you have any questions or concerns, please do not hesitate to call the office at [phone].  I look forward to our next visit and until then take care and stay safe.  Regards,   Dana Allan, MD   [facility]

## 2022-07-23 NOTE — Progress Notes (Signed)
SUBJECTIVE:   Chief Complaint  Patient presents with   Acute Visit    No fever/ body aches/ cough congestion/ dizzy/ sore throat/ X 2 days    HPI Patient presents to clinic for follow-up from video visit yesterday evening.  Reports symptoms started 3 days ago. Endorses nasal congestion, mild sore throat and posttussive chest comfort that does not radiate to back, jaw or arm.  Denies any fever since yesterday.  Last took ibuprofen at 2300 hrs. Denies any shortness of breath, cough, decreased smell, decrease in appetite.  PERTINENT PMH / PSH: WPW  OBJECTIVE:  BP 110/70   Pulse (!) 57   Temp 98.2 F (36.8 C) (Oral)   Ht  (1.778 m)   Wt 260 lb 12.8 oz (118.3 kg)   SpO2 98%   BMI 37.42 kg/m    Physical Exam Vitals reviewed.  Constitutional:      General: He is not in acute distress.    Appearance: Normal appearance. He is not ill-appearing, toxic-appearing or diaphoretic.  HENT:     Right Ear: There is no impacted cerumen.     Left Ear: There is no impacted cerumen.     Nose: Nose normal. No congestion or rhinorrhea.     Mouth/Throat:     Mouth: Mucous membranes are moist.     Pharynx: No oropharyngeal exudate or posterior oropharyngeal erythema.  Eyes:     General:        Right eye: No discharge.        Left eye: No discharge.     Conjunctiva/sclera: Conjunctivae normal.  Cardiovascular:     Rate and Rhythm: Normal rate and regular rhythm.     Heart sounds: Normal heart sounds.  Pulmonary:     Effort: Pulmonary effort is normal.     Breath sounds: Normal breath sounds. No wheezing or rhonchi.  Musculoskeletal:        General: Normal range of motion.  Lymphadenopathy:     Cervical: No cervical adenopathy.  Skin:    General: Skin is warm and dry.  Neurological:     General: No focal deficit present.     Mental Status: He is alert and oriented to person, place, and time. Mental status is at baseline.  Psychiatric:        Mood and Affect: Mood normal.         Behavior: Behavior normal.        Thought Content: Thought content normal.        Judgment: Judgment normal.     ASSESSMENT/PLAN:  COVID-19 Assessment & Plan: New problem.  Symptoms started 3 days ago.  In no acute respiratory distress.  Lungs clear on exam. Start antiviral therapy Symptom management. Tylenol 325-500 mg every 6 hours as needed Ibuprofen 200 mg every 8 hours as needed Stay well hydrated Rest as needed with frequent repositioning and ambulation.  Increase activity as soon as tolerated to help with recovery. Continue wearing masks, hand washing and self isolation until symptom free. Work for note provided Strict return precautions provided Follow-up with PCP as needed   Orders: -     molnupiravir EUA; Take 4 capsules (800 mg total) by mouth 2 (two) times daily for 5 days.  Dispense: 40 capsule; Refill: 0  Upper respiratory tract infection, unspecified type -     POC COVID-19 BinaxNow -     POCT Influenza A/B   PDMP reviewed  Return if symptoms worsen or fail to improve.  Kenney Houseman  Clent Ridges, MD

## 2022-07-31 ENCOUNTER — Encounter: Payer: Self-pay | Admitting: Family Medicine

## 2022-07-31 DIAGNOSIS — J069 Acute upper respiratory infection, unspecified: Secondary | ICD-10-CM | POA: Insufficient documentation

## 2022-07-31 NOTE — Patient Instructions (Addendum)
It was a pleasure meeting you today. Thank you for allowing me to take part in your health care.  Our goals for today as we discussed include:   Continue to hydrate Can take Tylenol 325 mg every 4-6 hours as needed. Nasal saline spray as needed Humidified air   Follow-up tomorrow morning in clinic at 8:20 AM   If you have any questions or concerns, please do not hesitate to call the office at [phone].  I look forward to our next visit and until then take care and stay safe.  Regards,   Dana Allan, MD   [facility]

## 2022-07-31 NOTE — Assessment & Plan Note (Addendum)
Symptoms of vertigo ongoing x greater than 1 week.  Recent febrile.  X 1 day.  Not in any acute respiratory distress.  Recommend patient come to clinic in a.m. for evaluation.  Plan for COVID, flu A&B testing.  Patient agreeable an appointment scheduled. Continue symptomatic treatment for overnight. If any worsening shortness of breath, chest pain patient aware to go to ED. Strict return precautions provided.

## 2022-08-01 ENCOUNTER — Encounter: Payer: Self-pay | Admitting: Family Medicine

## 2022-08-01 DIAGNOSIS — U071 COVID-19: Secondary | ICD-10-CM | POA: Insufficient documentation

## 2022-08-01 NOTE — Assessment & Plan Note (Signed)
New problem.  Symptoms started 3 days ago.  In no acute respiratory distress.  Lungs clear on exam. Start antiviral therapy Symptom management. Tylenol 325-500 mg every 6 hours as needed Ibuprofen 200 mg every 8 hours as needed Stay well hydrated Rest as needed with frequent repositioning and ambulation.  Increase activity as soon as tolerated to help with recovery. Continue wearing masks, hand washing and self isolation until symptom free. Work for note provided Strict return precautions provided Follow-up with PCP as needed

## 2022-09-05 ENCOUNTER — Other Ambulatory Visit: Payer: Self-pay | Admitting: Family

## 2022-09-05 DIAGNOSIS — I456 Pre-excitation syndrome: Secondary | ICD-10-CM

## 2022-09-08 NOTE — Telephone Encounter (Signed)
Pt had multiple recent mychart acute appts. with another provider, but last f/u was on 07/11/21, no future appts., please advise

## 2022-09-10 ENCOUNTER — Encounter: Payer: Self-pay | Admitting: Family

## 2022-09-10 NOTE — Telephone Encounter (Signed)
Sent mychart message & mailed letter

## 2022-10-10 ENCOUNTER — Other Ambulatory Visit: Payer: Self-pay | Admitting: Family

## 2022-10-10 DIAGNOSIS — I456 Pre-excitation syndrome: Secondary | ICD-10-CM

## 2022-11-07 ENCOUNTER — Telehealth: Payer: Self-pay | Admitting: Family

## 2022-11-07 DIAGNOSIS — F32A Depression, unspecified: Secondary | ICD-10-CM

## 2022-11-09 NOTE — Telephone Encounter (Signed)
Prescription Request  11/09/2022  LOV: Visit date not found  What is the name of the medication or equipment? escitalopram (LEXAPRO) 10 MG tablet  Have you contacted your pharmacy to request a refill? Yes   Which pharmacy would you like this sent to?   [id number] [address] [place] [address] Phone: [phone] Fax: [phone]    Patient notified that their request is being sent to the clinical staff for review and that they should receive a response within 2 business days.   Please advise at Mobile [phone] (mobile)

## 2022-11-10 NOTE — Telephone Encounter (Signed)
Rx sent in to pharmacy pt is aware 

## 2022-11-11 ENCOUNTER — Other Ambulatory Visit: Payer: Self-pay

## 2022-11-11 DIAGNOSIS — F419 Anxiety disorder, unspecified: Secondary | ICD-10-CM

## 2022-11-11 MED ORDER — ESCITALOPRAM OXALATE 10 MG PO TABS: 10.0000 mg | ORAL_TABLET | Freq: Every day | ORAL | 3 refills | Status: DC

## 2022-11-11 NOTE — Telephone Encounter (Signed)
Patient called stating that the pharmacy did not receive his Lexapro refill. I informed the providers CMA (Jenate. She stated she would take care of it.

## 2022-11-11 NOTE — Telephone Encounter (Signed)
Already handled by [facility].

## 2022-11-11 NOTE — Telephone Encounter (Signed)
Pt due for annual follow up for med refill  Please sch

## 2022-11-13 ENCOUNTER — Encounter: Payer: Self-pay | Admitting: Family

## 2022-11-26 ENCOUNTER — Encounter (INDEPENDENT_AMBULATORY_CARE_PROVIDER_SITE_OTHER): Payer: Self-pay

## 2022-12-14 DIAGNOSIS — J069 Acute upper respiratory infection, unspecified: Secondary | ICD-10-CM | POA: Diagnosis not present

## 2022-12-17 ENCOUNTER — Ambulatory Visit (INDEPENDENT_AMBULATORY_CARE_PROVIDER_SITE_OTHER): Payer: BC Managed Care – PPO | Admitting: Family

## 2022-12-17 ENCOUNTER — Encounter: Payer: Self-pay | Admitting: Family

## 2022-12-17 VITALS — BP 116/76 | HR 71 | Temp 98.0°F | Ht 70.0 in | Wt 254.0 lb

## 2022-12-17 DIAGNOSIS — F32A Depression, unspecified: Secondary | ICD-10-CM

## 2022-12-17 DIAGNOSIS — Z125 Encounter for screening for malignant neoplasm of prostate: Secondary | ICD-10-CM | POA: Diagnosis not present

## 2022-12-17 DIAGNOSIS — Z13220 Encounter for screening for lipoid disorders: Secondary | ICD-10-CM

## 2022-12-17 DIAGNOSIS — Z136 Encounter for screening for cardiovascular disorders: Secondary | ICD-10-CM

## 2022-12-17 DIAGNOSIS — I456 Pre-excitation syndrome: Secondary | ICD-10-CM

## 2022-12-17 DIAGNOSIS — F419 Anxiety disorder, unspecified: Secondary | ICD-10-CM | POA: Diagnosis not present

## 2022-12-17 NOTE — Progress Notes (Signed)
Assessment & Plan:  Pre-excitation syndrome Assessment & Plan: Overdue for follow-up with electrophysiology.  Reviewed last note patient to follow-up every 2 years.  I replaced referral. Continue Lopressor 50 mg twice daily  Orders: -     TSH; Future -     Ambulatory referral to Cardiology  Encounter for lipid screening for cardiovascular disease -     CBC with Differential/Platelet; Future -     Comprehensive metabolic panel; Future -     Lipid panel; Future -     TSH; Future  Screening for prostate cancer -     PSA; Future  Anxiety and depression Assessment & Plan: Chronic, stable.  Continue Lexapro 10 mg      Return precautions given.   Risks, benefits, and alternatives of the medications and treatment plan prescribed today were discussed, and patient expressed understanding.   Education regarding symptom management and diagnosis given to patient on AVS either electronically or printed.  Return in about 1 year (around 12/17/2023) for Fasting labs in 2-3 weeks.  Rennie Plowman, FNP  Subjective:    Patient ID: Eugene Robinson, male    DOB: 11-30-1970, 52 y.o.   MRN: [id number]  CC: Eugene Robinson is a 52 y.o. male who presents today for follow up.   HPI: Feels well today.  No new complaints.  Denies chest pain, palpitations, syncope.  Last seen by electrophysiology in 2019.       Allergies: Patient has no known allergies. Current Outpatient Medications on File Prior to Visit  Medication Sig Dispense Refill   escitalopram (LEXAPRO) 10 MG tablet Take 1 tablet (10 mg total) by mouth daily. 90 tablet 3   metoprolol tartrate (LOPRESSOR) 50 MG tablet TAKE 1 TABLET BY MOUTH TWICE A DAY 180 tablet 0   No current facility-administered medications on file prior to visit.    Review of Systems  Constitutional:  Negative for chills and fever.  Respiratory:  Negative for cough and shortness of breath.   Cardiovascular:  Negative for chest pain, palpitations and  leg swelling.  Gastrointestinal:  Negative for nausea and vomiting.      Objective:    BP 116/76   Pulse 71   Temp 98 F (36.7 C) (Oral)   Ht 5\' 10"  (1.778 m)   Wt 254 lb (115.2 kg)   SpO2 98%   BMI 36.45 kg/m  BP Readings from Last 3 Encounters:  12/17/22 116/76  07/23/22 110/70  12/29/21 130/80   Wt Readings from Last 3 Encounters:  12/17/22 254 lb (115.2 kg)  07/23/22 260 lb 12.8 oz (118.3 kg)  07/22/22 254 lb (115.2 kg)      12/17/2022    3:06 PM 07/22/2022    4:10 PM 12/29/2021    2:11 PM  Depression screen PHQ 2/9  Decreased Interest 0 0 0  Down, Depressed, Hopeless 0 0 0  PHQ - 2 Score 0 0 0     Physical Exam Vitals reviewed.  Constitutional:      Appearance: He is well-developed.  Cardiovascular:     Rate and Rhythm: Regular rhythm.     Heart sounds: Normal heart sounds.  Pulmonary:     Effort: Pulmonary effort is normal. No respiratory distress.     Breath sounds: Normal breath sounds. No wheezing, rhonchi or rales.  Skin:    General: Skin is warm and dry.  Neurological:     Mental Status: He is alert.  Psychiatric:  Speech: Speech normal.        Behavior: Behavior normal.

## 2022-12-17 NOTE — Assessment & Plan Note (Signed)
Overdue for follow-up with electrophysiology.  Reviewed last note patient to follow-up every 2 years.  I replaced referral. Continue Lopressor 50 mg twice daily

## 2022-12-17 NOTE — Assessment & Plan Note (Signed)
Chronic, stable.  Continue Lexapro 10 mg 

## 2022-12-17 NOTE — Patient Instructions (Signed)
Referral to electrophysiology, Dr. Graciela Husbands  Let us know if you dont hear back within a week in regards to an appointment being scheduled.   So that you are aware, if you are [facility] MyChart user , please pay attention to your MyChart messages as you may receive a MyChart message with a phone number to call and schedule this test/appointment own your own from our referral coordinator. This is a new process so I do not want you to miss this message.  If you are not a MyChart user, you will receive a phone call.

## 2022-12-31 ENCOUNTER — Other Ambulatory Visit (INDEPENDENT_AMBULATORY_CARE_PROVIDER_SITE_OTHER): Payer: BC Managed Care – PPO

## 2022-12-31 DIAGNOSIS — Z13220 Encounter for screening for lipoid disorders: Secondary | ICD-10-CM

## 2022-12-31 DIAGNOSIS — Z125 Encounter for screening for malignant neoplasm of prostate: Secondary | ICD-10-CM | POA: Diagnosis not present

## 2022-12-31 DIAGNOSIS — Z136 Encounter for screening for cardiovascular disorders: Secondary | ICD-10-CM | POA: Diagnosis not present

## 2022-12-31 DIAGNOSIS — I456 Pre-excitation syndrome: Secondary | ICD-10-CM

## 2022-12-31 LAB — CBC WITH DIFFERENTIAL/PLATELET
Basophils Absolute: 0.1 10*3/uL (ref 0.0–0.1)
Basophils Relative: 1.1 % (ref 0.0–3.0)
Eosinophils Absolute: 0.3 10*3/uL (ref 0.0–0.7)
Eosinophils Relative: 3.8 % (ref 0.0–5.0)
HCT: 43.9 % (ref 39.0–52.0)
Hemoglobin: 14.3 g/dL (ref 13.0–17.0)
Lymphocytes Relative: 19.8 % (ref 12.0–46.0)
Lymphs Abs: 1.3 10*3/uL (ref 0.7–4.0)
MCHC: 32.6 g/dL (ref 30.0–36.0)
MCV: 85.7 fl (ref 78.0–100.0)
Monocytes Absolute: 0.4 10*3/uL (ref 0.1–1.0)
Monocytes Relative: 6.4 % (ref 3.0–12.0)
Neutro Abs: 4.6 10*3/uL (ref 1.4–7.7)
Neutrophils Relative %: 68.9 % (ref 43.0–77.0)
Platelets: 253 10*3/uL (ref 150.0–400.0)
RBC: 5.12 Mil/uL (ref 4.22–5.81)
RDW: 13.5 % (ref 11.5–15.5)
WBC: 6.7 10*3/uL (ref 4.0–10.5)

## 2022-12-31 LAB — TSH: TSH: 0.65 u[IU]/mL (ref 0.35–5.50)

## 2022-12-31 LAB — COMPREHENSIVE METABOLIC PANEL WITH GFR
ALT: 20 U/L (ref 0–53)
AST: 17 U/L (ref 0–37)
Albumin: 4.1 g/dL (ref 3.5–5.2)
Alkaline Phosphatase: 100 U/L (ref 39–117)
BUN: 16 mg/dL (ref 6–23)
CO2: 27 meq/L (ref 19–32)
Calcium: 8.9 mg/dL (ref 8.4–10.5)
Chloride: 107 meq/L (ref 96–112)
Creatinine, Ser: 1.02 mg/dL (ref 0.40–1.50)
GFR: 83.52 mL/min
Glucose, Bld: 82 mg/dL (ref 70–99)
Potassium: 4.3 meq/L (ref 3.5–5.1)
Sodium: 141 meq/L (ref 135–145)
Total Bilirubin: 0.7 mg/dL (ref 0.2–1.2)
Total Protein: 7.4 g/dL (ref 6.0–8.3)

## 2022-12-31 LAB — LIPID PANEL
Cholesterol: 155 mg/dL (ref 0–200)
HDL: 32.2 mg/dL — ABNORMAL LOW
LDL Cholesterol: 93 mg/dL (ref 0–99)
NonHDL: 122.94
Total CHOL/HDL Ratio: 5
Triglycerides: 150 mg/dL — ABNORMAL HIGH (ref 0.0–149.0)
VLDL: 30 mg/dL (ref 0.0–40.0)

## 2022-12-31 LAB — PSA: PSA: 0.49 ng/mL (ref 0.10–4.00)

## 2023-01-16 ENCOUNTER — Other Ambulatory Visit: Payer: Self-pay | Admitting: Family

## 2023-01-16 DIAGNOSIS — I456 Pre-excitation syndrome: Secondary | ICD-10-CM

## 2023-02-01 ENCOUNTER — Ambulatory Visit: Payer: BC Managed Care – PPO | Admitting: Internal Medicine

## 2023-02-15 NOTE — Progress Notes (Unsigned)
Cardiology Office Note Date:  02/16/2023  Patient ID:  Eugene Robinson, Eugene Robinson April 12, 1971, MRN [id number] PCP:  Allegra Grana, FNP  Cardiologist:  None Electrophysiologist: Sherryl Manges, MD    Chief Complaint: 2 year follow-up  History of Present Illness: Eugene Robinson is a 52 y.o. male with PMH notable for WPW, PVCs, tachypalpitations; seen today for Sherryl Manges, MD for routine electrophysiology followup.  He was initially trailed on atenolol, lopressor, and Toprol to reduce palpitations, tolerated lopressor the best.  He last saw Dr. Graciela Husbands 06/2020 and was doing well at that time.   On follow-up today, he has no cardiac complaints. Remains very active playing disk-golf often and softball with daughter.  He denies palpitation episodes. No chest pain, chest pressure, SOB. No presyncope, dizziness or lightheadedness.  States his BP is always low     Past Medical History:  Diagnosis Date   Chicken pox    Cough 08/27/2015   GERD (gastroesophageal reflux disease)    Kidney stones    Osteomyelitis (HCC) 2017   spinal infection no clear etiology; hospitialized 2017 for 2 weeks.    Pleural effusion 2017   PVC's (premature ventricular contractions) 03/19/2015   WPW (Wolff-Parkinson-White syndrome)     Past Surgical History:  Procedure Laterality Date   ADENOIDECTOMY      Current Outpatient Medications  Medication Instructions   escitalopram (LEXAPRO) 10 mg, Oral, Daily   metoprolol tartrate (LOPRESSOR) 50 mg, Oral, 2 times daily    Social History:  The patient  reports that he has never smoked. He has never used smokeless tobacco. He reports that he does not drink alcohol and does not use drugs.   Family History:  The patient's family history includes Cancer in his mother; Parkinson's disease in his mother.  ROS:  Please see the history of present illness. All other systems are reviewed and otherwise negative.   PHYSICAL EXAM:  VS:  BP (!) 115/58 (BP Location: Left  Arm, Patient Position: Sitting, Cuff Size: Normal)   Ht 5\' 10"  (1.778 m)   Wt 260 lb 9.6 oz (118.2 kg)   SpO2 96%   BMI 37.39 kg/m  BMI: Body mass index is 37.39 kg/m.  GEN- The patient is well appearing, alert and oriented x 3 today.   Lungs- Clear to ausculation bilaterally, normal work of breathing.  Heart- Regular rate and rhythm, no murmurs, rubs or gallops Extremities- No peripheral edema, warm, dry   EKG is ordered. Personal review of EKG from today shows:    EKG Interpretation Date/Time:  Tuesday February 16 2023 13:41:27 EST Ventricular Rate:  65 PR Interval:  98 QRS Duration:  140 QT Interval:  462 QTC Calculation: 480 R Axis:   -33  Text Interpretation: Normal sinus rhythm Wolff-Parkinson-White Confirmed by Sherie Don [phone]) on 02/16/2023 1:45:26 PM    Recent Labs: 12/31/2022: ALT 20; BUN 16; Creatinine, Ser 1.02; Hemoglobin 14.3; Platelets 253.0; Potassium 4.3; Sodium 141; TSH 0.65  12/31/2022: Cholesterol 155; HDL 32.20; LDL Cholesterol 93; Total CHOL/HDL Ratio 5; Triglycerides 150.0; VLDL 30.0   CrCl cannot be calculated (Patient's most recent lab result is older than the maximum 21 days allowed.).   Wt Readings from Last 3 Encounters:  02/16/23 260 lb 9.6 oz (118.2 kg)  12/17/22 254 lb (115.2 kg)  07/23/22 260 lb 12.8 oz (118.3 kg)     Additional studies reviewed include: Previous EP, cardiology notes.   TTE, 07/03/2015 - Left ventricle: The cavity size was normal. Systolic function  was normal. The estimated ejection fraction was in the range of 60% to 65%. Wall motion was normal; there were no regional wall motion abnormalities. Left ventricular diastolic function parameters were normal.  - Mitral valve: There was mild regurgitation.  - Left atrium: The atrium was mildly dilated.  - Right ventricle: Systolic function was normal.  - Pulmonary arteries: Systolic pressure was within the normal range.   Impressions:  - No valve vegetation noted.     ASSESSMENT AND PLAN:  #) WPW #) PVC #) palpitations Rare palpitation episodes Tolerating lopressor well, continue 50mg  BID Discussed rationale for WPW ablation, patient remains disinterested in the procedure        Current medicines are reviewed at length with the patient today.   The patient does not have concerns regarding his medicines.  The following changes were made today:  none  Labs/ tests ordered today include:  Orders Placed This Encounter  Procedures   EKG 12-Lead     Disposition: Follow up with Dr. Graciela Husbands or EP APP in 12 months   Signed, Sherie Don, NP  02/16/23  2:07 PM  Electrophysiology [facility]

## 2023-02-16 ENCOUNTER — Encounter: Payer: Self-pay | Admitting: Cardiology

## 2023-02-16 ENCOUNTER — Ambulatory Visit: Payer: BC Managed Care – PPO | Attending: Internal Medicine | Admitting: Cardiology

## 2023-02-16 VITALS — BP 115/58 | Ht 70.0 in | Wt 260.6 lb

## 2023-02-16 DIAGNOSIS — I493 Ventricular premature depolarization: Secondary | ICD-10-CM

## 2023-02-16 DIAGNOSIS — R002 Palpitations: Secondary | ICD-10-CM

## 2023-02-16 DIAGNOSIS — I456 Pre-excitation syndrome: Secondary | ICD-10-CM | POA: Diagnosis not present

## 2023-02-16 NOTE — Patient Instructions (Signed)
Medication Instructions:  No changes *If you need a refill on your cardiac medications before your next appointment, please call your pharmacy*   Lab Work: None ordered If you have labs (blood work) drawn today and your tests are completely normal, you will receive your results only by: MyChart Message (if you have MyChart) OR A paper copy in the mail If you have any lab test that is abnormal or we need to change your treatment, we will call you to review the results.   Testing/Procedures: None ordered   Follow-Up: At [facility], you and your health needs are our priority.  As part of our continuing mission to provide you with exceptional heart care, we have created designated Provider Care Teams.  These Care Teams include your primary Cardiologist (physician) and Advanced Practice Providers (APPs -  Physician Assistants and Nurse Practitioners) who all work together to provide you with the care you need, when you need it.  We recommend signing up for the patient portal called "MyChart".  Sign up information is provided on this After Visit Summary.  MyChart is used to connect with patients for Virtual Visits (Telemedicine).  Patients are able to view lab/test results, encounter notes, upcoming appointments, etc.  Non-urgent messages can be sent to your provider as well.   To learn more about what you can do with MyChart, go to [email or web address].    Your next appointment:   12 month(s)  Provider:   Dr. Graciela Husbands or Sherie Don, NP

## 2023-04-27 ENCOUNTER — Other Ambulatory Visit: Payer: Self-pay | Admitting: Family

## 2023-04-27 DIAGNOSIS — I456 Pre-excitation syndrome: Secondary | ICD-10-CM

## 2023-07-31 ENCOUNTER — Other Ambulatory Visit: Payer: Self-pay | Admitting: Family

## 2023-07-31 DIAGNOSIS — I456 Pre-excitation syndrome: Secondary | ICD-10-CM

## 2023-09-29 ENCOUNTER — Ambulatory Visit: Payer: Self-pay | Admitting: *Deleted

## 2023-09-29 NOTE — Telephone Encounter (Signed)
 Spoke to pt he stated that he is going to UC in the am

## 2023-09-29 NOTE — Telephone Encounter (Signed)
  FYI Only or Action Required?: Action required by provider: request for appointment.  Patient was last seen in primary care on 12/17/2022 by Calista Catching, FNP. Called Nurse Triage reporting Hip Pain. Symptoms began several days ago. Interventions attempted: Rest, hydration, or home remedies. Symptoms are: rapidly worsening.  Triage Disposition: See HCP Within 4 Hours (Or PCP Triage)  Patient/caregiver understands and will follow disposition?: yes                     Copied from CRM [id number]. Topic: Clinical - Red Word Triage >> Sep 29, 2023  4:16 PM Adrionna Y wrote: Red Word that prompted transfer to Nurse Triage: trauma   Patient states he hurt his hip and its in pain Reason for Disposition  [1] SEVERE pain (e.g., excruciating, unable to do any normal activities) AND [2] not improved after 2 hours of pain medicine  Answer Assessment - Initial Assessment Questions 1. LOCATION and RADIATION: Where is the pain located?      Right hip to back and side 2. QUALITY: What does the pain feel like?  (e.g., sharp, dull, aching, burning)     Sharp and stabbing  3. SEVERITY: How bad is the pain? What does it keep you from doing?   (Scale 1-10; or mild, moderate, severe)   -  MILD (1-3): doesn't interfere with normal activities    -  MODERATE (4-7): interferes with normal activities (e.g., work or school) or awakens from sleep, limping    -  SEVERE (8-10): excruciating pain, unable to do any normal activities, unable to walk     8/10 with mobility  4. ONSET: When did the pain start? Does it come and go, or is it there all the time?     3-4 days ago and patient turned to right and fell or eased to ground, right leg gave way 5. WORK OR EXERCISE: Has there been any recent work or exercise that involved this part of the body?      Na  6. CAUSE: What do you think is causing the hip pain?      Not sure  7. AGGRAVATING FACTORS: What makes the hip pain worse?  (e.g., walking, climbing stairs, running)     Difficulty walking worsening climbing stairs  8. OTHER SYMPTOMS: Do you have any other symptoms? (e.g., back pain, pain shooting down leg,  fever, rash)     Right flank , hip top and side around back  No available appt until June 26. Recommended patient go to UC for evaluation.  Protocols used: Hip Pain-A-AH

## 2023-11-02 ENCOUNTER — Other Ambulatory Visit: Payer: Self-pay | Admitting: Family

## 2023-11-02 DIAGNOSIS — F419 Anxiety disorder, unspecified: Secondary | ICD-10-CM

## 2023-12-23 ENCOUNTER — Encounter: Payer: BC Managed Care – PPO | Admitting: Family
